# Patient Record
Sex: Male | Born: 1945 | Race: White | Hispanic: No | Marital: Married | Smoking: Former smoker
Health system: Southern US, Community
[De-identification: ages and names within clinical notes are randomized; demographics above are authoritative.]

## PROBLEM LIST (undated history)

## (undated) DIAGNOSIS — E785 Hyperlipidemia, unspecified: Secondary | ICD-10-CM

## (undated) DIAGNOSIS — IMO0002 Reserved for concepts with insufficient information to code with codable children: Secondary | ICD-10-CM

## (undated) DIAGNOSIS — S329XXA Fracture of unspecified parts of lumbosacral spine and pelvis, initial encounter for closed fracture: Secondary | ICD-10-CM

## (undated) DIAGNOSIS — I2510 Atherosclerotic heart disease of native coronary artery without angina pectoris: Secondary | ICD-10-CM

## (undated) DIAGNOSIS — C760 Malignant neoplasm of head, face and neck: Secondary | ICD-10-CM

## (undated) DIAGNOSIS — I6529 Occlusion and stenosis of unspecified carotid artery: Secondary | ICD-10-CM

## (undated) DIAGNOSIS — Z923 Personal history of irradiation: Secondary | ICD-10-CM

## (undated) DIAGNOSIS — S14127A Central cord syndrome at C7 level of cervical spinal cord, initial encounter: Secondary | ICD-10-CM

## (undated) DIAGNOSIS — E1149 Type 2 diabetes mellitus with other diabetic neurological complication: Principal | ICD-10-CM

## (undated) DIAGNOSIS — I2119 ST elevation (STEMI) myocardial infarction involving other coronary artery of inferior wall: Secondary | ICD-10-CM

## (undated) DIAGNOSIS — I10 Essential (primary) hypertension: Secondary | ICD-10-CM

## (undated) DIAGNOSIS — K5790 Diverticulosis of intestine, part unspecified, without perforation or abscess without bleeding: Secondary | ICD-10-CM

## (undated) DIAGNOSIS — C189 Malignant neoplasm of colon, unspecified: Principal | ICD-10-CM

## (undated) HISTORY — PX: CARPAL TUNNEL RELEASE: SHX101

## (undated) HISTORY — DX: Personal history of irradiation: Z92.3

## (undated) HISTORY — DX: Essential (primary) hypertension: I10

## (undated) HISTORY — PX: ELBOW SURGERY: SHX618

## (undated) HISTORY — DX: Diverticulosis of intestine, part unspecified, without perforation or abscess without bleeding: K57.90

## (undated) HISTORY — DX: Fracture of unspecified parts of lumbosacral spine and pelvis, initial encounter for closed fracture: S32.9XXA

## (undated) HISTORY — DX: Central cord syndrome at C7 level of cervical spinal cord, initial encounter: S14.127A

## (undated) HISTORY — PX: TONSILLECTOMY: SUR1361

## (undated) HISTORY — DX: Hyperlipidemia, unspecified: E78.5

## (undated) HISTORY — DX: Malignant neoplasm of colon, unspecified: C18.9

## (undated) HISTORY — DX: Occlusion and stenosis of unspecified carotid artery: I65.29

## (undated) HISTORY — DX: Reserved for concepts with insufficient information to code with codable children: IMO0002

## (undated) HISTORY — DX: ST elevation (STEMI) myocardial infarction involving other coronary artery of inferior wall: I21.19

## (undated) HISTORY — DX: Type 2 diabetes mellitus with other diabetic neurological complication: E11.49

## (undated) HISTORY — DX: Malignant neoplasm of head, face and neck: C76.0

## (undated) HISTORY — DX: Atherosclerotic heart disease of native coronary artery without angina pectoris: I25.10

## (undated) HISTORY — PX: OTHER SURGICAL HISTORY: SHX169

---

## 1986-07-21 DIAGNOSIS — I2119 ST elevation (STEMI) myocardial infarction involving other coronary artery of inferior wall: Secondary | ICD-10-CM

## 1986-07-21 HISTORY — DX: ST elevation (STEMI) myocardial infarction involving other coronary artery of inferior wall: I21.19

## 1986-11-19 DIAGNOSIS — I2510 Atherosclerotic heart disease of native coronary artery without angina pectoris: Secondary | ICD-10-CM

## 1986-11-19 HISTORY — DX: Atherosclerotic heart disease of native coronary artery without angina pectoris: I25.10

## 1990-11-19 DIAGNOSIS — S329XXA Fracture of unspecified parts of lumbosacral spine and pelvis, initial encounter for closed fracture: Secondary | ICD-10-CM

## 1990-11-19 HISTORY — DX: Fracture of unspecified parts of lumbosacral spine and pelvis, initial encounter for closed fracture: S32.9XXA

## 1992-12-19 HISTORY — PX: PENILE PROSTHESIS IMPLANT: SHX240

## 1997-03-21 HISTORY — PX: OTHER SURGICAL HISTORY: SHX169

## 1997-10-18 ENCOUNTER — Encounter: Admission: RE | Admit: 1997-10-18 | Discharge: 1998-01-16 | Payer: Self-pay | Admitting: Emergency Medicine

## 1998-01-09 ENCOUNTER — Ambulatory Visit (HOSPITAL_BASED_OUTPATIENT_CLINIC_OR_DEPARTMENT_OTHER): Admission: RE | Admit: 1998-01-09 | Discharge: 1998-01-09 | Payer: Self-pay | Admitting: Orthopedic Surgery

## 2001-07-21 DIAGNOSIS — C760 Malignant neoplasm of head, face and neck: Secondary | ICD-10-CM

## 2001-07-21 HISTORY — DX: Malignant neoplasm of head, face and neck: C76.0

## 2001-11-18 HISTORY — PX: ANGIOPLASTY: SHX39

## 2001-11-18 HISTORY — PX: CARDIAC CATHETERIZATION: SHX172

## 2001-11-29 ENCOUNTER — Ambulatory Visit (HOSPITAL_COMMUNITY): Admission: RE | Admit: 2001-11-29 | Discharge: 2001-11-29 | Payer: Self-pay | Admitting: Interventional Cardiology

## 2001-11-29 ENCOUNTER — Encounter: Payer: Self-pay | Admitting: Interventional Cardiology

## 2001-11-29 ENCOUNTER — Encounter: Payer: Self-pay | Admitting: Family Medicine

## 2002-07-18 ENCOUNTER — Ambulatory Visit (HOSPITAL_BASED_OUTPATIENT_CLINIC_OR_DEPARTMENT_OTHER): Admission: RE | Admit: 2002-07-18 | Discharge: 2002-07-18 | Payer: Self-pay | Admitting: Otolaryngology

## 2002-07-18 ENCOUNTER — Encounter (INDEPENDENT_AMBULATORY_CARE_PROVIDER_SITE_OTHER): Payer: Self-pay | Admitting: Specialist

## 2002-07-26 ENCOUNTER — Encounter: Admission: RE | Admit: 2002-07-26 | Discharge: 2002-07-26 | Payer: Self-pay | Admitting: Otolaryngology

## 2002-07-26 ENCOUNTER — Encounter: Payer: Self-pay | Admitting: Otolaryngology

## 2002-07-29 ENCOUNTER — Ambulatory Visit (HOSPITAL_BASED_OUTPATIENT_CLINIC_OR_DEPARTMENT_OTHER): Admission: RE | Admit: 2002-07-29 | Discharge: 2002-07-29 | Payer: Self-pay | Admitting: Otolaryngology

## 2002-07-29 ENCOUNTER — Encounter (INDEPENDENT_AMBULATORY_CARE_PROVIDER_SITE_OTHER): Payer: Self-pay | Admitting: *Deleted

## 2002-08-04 ENCOUNTER — Ambulatory Visit: Admission: RE | Admit: 2002-08-04 | Discharge: 2002-10-17 | Payer: Self-pay | Admitting: Radiation Oncology

## 2002-08-09 ENCOUNTER — Encounter (HOSPITAL_COMMUNITY): Admission: RE | Admit: 2002-08-09 | Discharge: 2002-09-21 | Payer: Self-pay | Admitting: Dentistry

## 2002-08-10 ENCOUNTER — Encounter (HOSPITAL_COMMUNITY): Payer: Self-pay | Admitting: Dentistry

## 2002-08-18 ENCOUNTER — Ambulatory Visit (HOSPITAL_COMMUNITY): Admission: RE | Admit: 2002-08-18 | Discharge: 2002-08-18 | Payer: Self-pay | Admitting: Radiation Oncology

## 2002-08-21 DIAGNOSIS — Z923 Personal history of irradiation: Secondary | ICD-10-CM

## 2002-08-21 HISTORY — DX: Personal history of irradiation: Z92.3

## 2002-10-07 ENCOUNTER — Encounter (HOSPITAL_COMMUNITY): Admission: RE | Admit: 2002-10-07 | Discharge: 2002-10-14 | Payer: Self-pay | Admitting: Radiation Oncology

## 2002-10-14 ENCOUNTER — Encounter: Payer: Self-pay | Admitting: Emergency Medicine

## 2002-10-14 ENCOUNTER — Inpatient Hospital Stay (HOSPITAL_COMMUNITY): Admission: AD | Admit: 2002-10-14 | Discharge: 2002-10-17 | Payer: Self-pay | Admitting: Emergency Medicine

## 2002-10-15 ENCOUNTER — Encounter (INDEPENDENT_AMBULATORY_CARE_PROVIDER_SITE_OTHER): Payer: Self-pay | Admitting: Specialist

## 2002-10-20 ENCOUNTER — Ambulatory Visit: Admission: RE | Admit: 2002-10-20 | Discharge: 2002-10-20 | Payer: Self-pay | Admitting: Radiation Oncology

## 2002-11-01 ENCOUNTER — Encounter: Admission: EM | Admit: 2002-11-01 | Discharge: 2002-11-01 | Payer: Self-pay | Admitting: Dentistry

## 2002-11-25 ENCOUNTER — Ambulatory Visit (HOSPITAL_COMMUNITY): Admission: RE | Admit: 2002-11-25 | Discharge: 2002-11-25 | Payer: Self-pay | Admitting: Radiation Oncology

## 2003-02-08 ENCOUNTER — Ambulatory Visit (HOSPITAL_COMMUNITY): Admission: RE | Admit: 2003-02-08 | Discharge: 2003-02-08 | Payer: Self-pay | Admitting: *Deleted

## 2003-02-19 HISTORY — PX: CAROTID ENDARTERECTOMY: SUR193

## 2003-02-24 ENCOUNTER — Encounter: Payer: Self-pay | Admitting: *Deleted

## 2003-02-28 ENCOUNTER — Inpatient Hospital Stay (HOSPITAL_COMMUNITY): Admission: RE | Admit: 2003-02-28 | Discharge: 2003-03-01 | Payer: Self-pay | Admitting: *Deleted

## 2003-02-28 ENCOUNTER — Encounter (INDEPENDENT_AMBULATORY_CARE_PROVIDER_SITE_OTHER): Payer: Self-pay | Admitting: Specialist

## 2003-05-04 ENCOUNTER — Ambulatory Visit: Admission: RE | Admit: 2003-05-04 | Discharge: 2003-05-04 | Payer: Self-pay | Admitting: Radiation Oncology

## 2003-08-22 ENCOUNTER — Ambulatory Visit (HOSPITAL_COMMUNITY): Admission: RE | Admit: 2003-08-22 | Discharge: 2003-08-22 | Payer: Self-pay | Admitting: Radiation Oncology

## 2003-08-24 ENCOUNTER — Ambulatory Visit: Admission: RE | Admit: 2003-08-24 | Discharge: 2003-08-24 | Payer: Self-pay | Admitting: Radiation Oncology

## 2003-11-23 ENCOUNTER — Ambulatory Visit: Admission: RE | Admit: 2003-11-23 | Discharge: 2003-11-23 | Payer: Self-pay | Admitting: Radiation Oncology

## 2004-03-07 ENCOUNTER — Ambulatory Visit: Admission: RE | Admit: 2004-03-07 | Discharge: 2004-03-07 | Payer: Self-pay | Admitting: Radiation Oncology

## 2004-03-21 ENCOUNTER — Ambulatory Visit: Admission: RE | Admit: 2004-03-21 | Discharge: 2004-03-21 | Payer: Self-pay | Admitting: Radiation Oncology

## 2004-06-20 ENCOUNTER — Ambulatory Visit: Admission: RE | Admit: 2004-06-20 | Discharge: 2004-06-20 | Payer: Self-pay | Admitting: Radiation Oncology

## 2004-10-23 ENCOUNTER — Ambulatory Visit: Admission: RE | Admit: 2004-10-23 | Discharge: 2004-10-23 | Payer: Self-pay | Admitting: Radiation Oncology

## 2004-10-31 ENCOUNTER — Ambulatory Visit (HOSPITAL_COMMUNITY): Admission: RE | Admit: 2004-10-31 | Discharge: 2004-10-31 | Payer: Self-pay | Admitting: Radiation Oncology

## 2005-02-26 ENCOUNTER — Ambulatory Visit: Admission: RE | Admit: 2005-02-26 | Discharge: 2005-02-28 | Payer: Self-pay | Admitting: Radiation Oncology

## 2005-07-21 HISTORY — PX: OTHER SURGICAL HISTORY: SHX169

## 2005-11-06 ENCOUNTER — Ambulatory Visit: Admission: RE | Admit: 2005-11-06 | Discharge: 2005-11-27 | Payer: Self-pay | Admitting: Radiation Oncology

## 2005-11-10 ENCOUNTER — Ambulatory Visit (HOSPITAL_COMMUNITY): Admission: RE | Admit: 2005-11-10 | Discharge: 2005-11-10 | Payer: Self-pay | Admitting: Radiation Oncology

## 2006-06-20 DIAGNOSIS — IMO0002 Reserved for concepts with insufficient information to code with codable children: Secondary | ICD-10-CM

## 2006-06-20 HISTORY — DX: Reserved for concepts with insufficient information to code with codable children: IMO0002

## 2006-06-29 ENCOUNTER — Inpatient Hospital Stay (HOSPITAL_COMMUNITY): Admission: EM | Admit: 2006-06-29 | Discharge: 2006-07-07 | Payer: Self-pay | Admitting: Emergency Medicine

## 2006-07-06 ENCOUNTER — Encounter (INDEPENDENT_AMBULATORY_CARE_PROVIDER_SITE_OTHER): Payer: Self-pay | Admitting: Cardiology

## 2006-07-07 ENCOUNTER — Inpatient Hospital Stay (HOSPITAL_COMMUNITY)
Admission: RE | Admit: 2006-07-07 | Discharge: 2006-07-30 | Payer: Self-pay | Admitting: Physical Medicine & Rehabilitation

## 2006-07-07 ENCOUNTER — Ambulatory Visit: Payer: Self-pay | Admitting: Physical Medicine & Rehabilitation

## 2006-09-04 ENCOUNTER — Ambulatory Visit: Payer: Self-pay | Admitting: Physical Medicine & Rehabilitation

## 2006-09-04 ENCOUNTER — Encounter
Admission: RE | Admit: 2006-09-04 | Discharge: 2006-12-03 | Payer: Self-pay | Admitting: Physical Medicine & Rehabilitation

## 2006-09-30 ENCOUNTER — Encounter: Admission: RE | Admit: 2006-09-30 | Discharge: 2006-11-13 | Payer: Self-pay | Admitting: Neurosurgery

## 2006-10-01 ENCOUNTER — Ambulatory Visit: Payer: Self-pay | Admitting: *Deleted

## 2006-11-02 ENCOUNTER — Ambulatory Visit: Payer: Self-pay | Admitting: Physical Medicine & Rehabilitation

## 2006-11-24 ENCOUNTER — Inpatient Hospital Stay (HOSPITAL_COMMUNITY): Admission: RE | Admit: 2006-11-24 | Discharge: 2006-11-27 | Payer: Self-pay | Admitting: Neurosurgery

## 2007-01-15 ENCOUNTER — Ambulatory Visit: Payer: Self-pay | Admitting: *Deleted

## 2007-01-15 ENCOUNTER — Ambulatory Visit: Payer: Self-pay | Admitting: Physical Medicine & Rehabilitation

## 2007-01-15 ENCOUNTER — Encounter
Admission: RE | Admit: 2007-01-15 | Discharge: 2007-04-15 | Payer: Self-pay | Admitting: Physical Medicine & Rehabilitation

## 2007-01-19 LAB — HM COLONOSCOPY: HM Colonoscopy: NORMAL

## 2007-09-19 HISTORY — PX: ROTATOR CUFF REPAIR: SHX139

## 2007-10-27 ENCOUNTER — Observation Stay (HOSPITAL_COMMUNITY): Admission: RE | Admit: 2007-10-27 | Discharge: 2007-10-28 | Payer: Self-pay | Admitting: Orthopedic Surgery

## 2007-11-23 ENCOUNTER — Ambulatory Visit (HOSPITAL_COMMUNITY): Admission: RE | Admit: 2007-11-23 | Discharge: 2007-11-23 | Payer: Self-pay | Admitting: Radiation Oncology

## 2007-12-09 ENCOUNTER — Ambulatory Visit: Payer: Self-pay | Admitting: *Deleted
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
## 2008-07-04 ENCOUNTER — Ambulatory Visit: Payer: Self-pay | Admitting: Family Medicine

## 2008-07-04 DIAGNOSIS — I10 Essential (primary) hypertension: Secondary | ICD-10-CM

## 2008-07-04 DIAGNOSIS — I2510 Atherosclerotic heart disease of native coronary artery without angina pectoris: Secondary | ICD-10-CM

## 2008-07-04 DIAGNOSIS — E1140 Type 2 diabetes mellitus with diabetic neuropathy, unspecified: Secondary | ICD-10-CM

## 2008-07-04 DIAGNOSIS — E785 Hyperlipidemia, unspecified: Secondary | ICD-10-CM

## 2008-07-05 ENCOUNTER — Encounter: Payer: Self-pay | Admitting: Family Medicine

## 2008-07-05 LAB — CONVERTED CEMR LAB: LDL Cholesterol: 68 mg/dL

## 2008-07-28 ENCOUNTER — Encounter: Payer: Self-pay | Admitting: Family Medicine

## 2008-08-25 ENCOUNTER — Encounter: Payer: Self-pay | Admitting: Family Medicine

## 2008-09-05 ENCOUNTER — Ambulatory Visit: Payer: Self-pay | Admitting: Family Medicine

## 2008-10-04 ENCOUNTER — Ambulatory Visit: Payer: Self-pay | Admitting: Family Medicine

## 2008-11-13 ENCOUNTER — Encounter: Payer: Self-pay | Admitting: Family Medicine

## 2008-11-14 ENCOUNTER — Ambulatory Visit: Payer: Self-pay | Admitting: Family Medicine

## 2008-11-14 DIAGNOSIS — R1310 Dysphagia, unspecified: Secondary | ICD-10-CM | POA: Insufficient documentation

## 2008-11-14 DIAGNOSIS — K219 Gastro-esophageal reflux disease without esophagitis: Secondary | ICD-10-CM | POA: Insufficient documentation

## 2008-11-17 ENCOUNTER — Encounter: Payer: Self-pay | Admitting: Family Medicine

## 2008-12-07 ENCOUNTER — Ambulatory Visit: Payer: Self-pay | Admitting: *Deleted

## 2009-01-23 ENCOUNTER — Ambulatory Visit: Payer: Self-pay | Admitting: Family Medicine

## 2009-02-07 ENCOUNTER — Ambulatory Visit: Payer: Self-pay | Admitting: Family Medicine

## 2009-02-20 ENCOUNTER — Ambulatory Visit: Payer: Self-pay | Admitting: Family Medicine

## 2009-04-02 ENCOUNTER — Ambulatory Visit: Payer: Self-pay | Admitting: Family Medicine

## 2009-06-21 ENCOUNTER — Ambulatory Visit: Payer: Self-pay | Admitting: Family Medicine

## 2009-06-21 DIAGNOSIS — R5381 Other malaise: Secondary | ICD-10-CM

## 2009-06-21 DIAGNOSIS — R5383 Other fatigue: Secondary | ICD-10-CM

## 2009-06-21 LAB — CONVERTED CEMR LAB
ALT: 17 units/L (ref 0–53)
AST: 20 units/L (ref 0–37)
Albumin: 3.8 g/dL (ref 3.5–5.2)
BUN: 16 mg/dL (ref 6–23)
Cholesterol: 140 mg/dL (ref 0–200)
Creatinine, Ser: 1.5 mg/dL (ref 0.4–1.5)
Eosinophils Relative: 6.2 % — ABNORMAL HIGH (ref 0.0–5.0)
GFR calc non Af Amer: 50.04 mL/min (ref >60)
Glucose, Bld: 133 mg/dL — ABNORMAL HIGH (ref 70–99)
HCT: 42.6 % (ref 39.0–52.0)
Monocytes Relative: 10.7 % (ref 3.0–12.0)
Neutrophils Relative %: 62 % (ref 43.0–77.0)
PSA: 0.65 ng/mL (ref 0.10–4.00)
Platelets: 128 10*3/uL — ABNORMAL LOW (ref 150.0–400.0)
Potassium: 4.7 meq/L (ref 3.5–5.1)
TSH: 4.9 microintl units/mL (ref 0.35–5.50)
Triglycerides: 140 mg/dL (ref 0.0–149.0)
VLDL: 28 mg/dL (ref 0.0–40.0)
WBC: 6.5 10*3/uL (ref 4.5–10.5)

## 2009-07-02 ENCOUNTER — Ambulatory Visit: Payer: Self-pay | Admitting: Family Medicine

## 2009-07-02 LAB — CONVERTED CEMR LAB
Cholesterol, target level: 200 mg/dL
LDL Goal: 70 mg/dL

## 2009-11-06 ENCOUNTER — Ambulatory Visit: Payer: Self-pay | Admitting: Family Medicine

## 2009-11-27 ENCOUNTER — Ambulatory Visit: Payer: Self-pay | Admitting: Family Medicine

## 2009-11-27 LAB — CONVERTED CEMR LAB
BUN: 19 mg/dL (ref 6–23)
Calcium: 9.4 mg/dL (ref 8.4–10.5)
Creatinine, Ser: 1.3 mg/dL (ref 0.4–1.5)
GFR calc non Af Amer: 60.56 mL/min (ref >60)
Hgb A1c MFr Bld: 6.6 % — ABNORMAL HIGH (ref 4.6–6.5)

## 2009-11-29 ENCOUNTER — Ambulatory Visit: Payer: Self-pay | Admitting: Vascular Surgery

## 2009-12-05 ENCOUNTER — Ambulatory Visit: Payer: Self-pay | Admitting: Family Medicine

## 2010-04-29 ENCOUNTER — Encounter: Payer: Self-pay | Admitting: Family Medicine

## 2010-06-12 ENCOUNTER — Encounter: Payer: Self-pay | Admitting: Family Medicine

## 2010-06-26 ENCOUNTER — Telehealth (INDEPENDENT_AMBULATORY_CARE_PROVIDER_SITE_OTHER): Payer: Self-pay | Admitting: *Deleted

## 2010-07-03 ENCOUNTER — Ambulatory Visit: Payer: Self-pay | Admitting: Family Medicine

## 2010-07-03 LAB — CONVERTED CEMR LAB
AST: 15 units/L (ref 0–37)
BUN: 15 mg/dL (ref 6–23)
Basophils Absolute: 0.1 10*3/uL (ref 0.0–0.1)
Basophils Relative: 1.1 % (ref 0.0–3.0)
Eosinophils Relative: 8.4 % — ABNORMAL HIGH (ref 0.0–5.0)
GFR calc non Af Amer: 62.72 mL/min (ref >60.00)
HCT: 40.1 % (ref 39.0–52.0)
Hemoglobin: 13.5 g/dL (ref 13.0–17.0)
LDL Cholesterol: 80 mg/dL (ref 0–99)
Lymphs Abs: 1.6 10*3/uL (ref 0.7–4.0)
Monocytes Relative: 8.8 % (ref 3.0–12.0)
Neutro Abs: 3.5 10*3/uL (ref 1.4–7.7)
Potassium: 4.5 meq/L (ref 3.5–5.1)
RDW: 12.8 % (ref 11.5–14.6)
Sodium: 138 meq/L (ref 135–145)
TSH: 7.01 microintl units/mL — ABNORMAL HIGH (ref 0.35–5.50)
Total Bilirubin: 0.4 mg/dL (ref 0.3–1.2)
VLDL: 34 mg/dL (ref 0.0–40.0)

## 2010-07-08 ENCOUNTER — Encounter: Payer: Self-pay | Admitting: Family Medicine

## 2010-07-08 ENCOUNTER — Telehealth: Payer: Self-pay | Admitting: Family Medicine

## 2010-07-08 ENCOUNTER — Ambulatory Visit: Payer: Self-pay | Admitting: Family Medicine

## 2010-07-08 DIAGNOSIS — E039 Hypothyroidism, unspecified: Secondary | ICD-10-CM

## 2010-07-08 LAB — CONVERTED CEMR LAB
T3, Free: 2.7 pg/mL (ref 2.3–4.2)
T4, Total: 6.8 ug/dL (ref 5.0–12.5)

## 2010-07-09 ENCOUNTER — Encounter (INDEPENDENT_AMBULATORY_CARE_PROVIDER_SITE_OTHER): Payer: Self-pay | Admitting: *Deleted

## 2010-08-14 ENCOUNTER — Ambulatory Visit
Admission: RE | Admit: 2010-08-14 | Discharge: 2010-08-14 | Payer: Self-pay | Source: Home / Self Care | Attending: Family Medicine | Admitting: Family Medicine

## 2010-08-20 NOTE — Assessment & Plan Note (Signed)
Summary: 10:00 30 MIN APPT/CK LACERATED FINGER   Vital Signs:  Patient profile:   65 year old male Height:      69 inches Weight:      243.6 pounds BMI:     36.10 Temp:     97.6 degrees F oral Pulse rate:   72 / minute Pulse rhythm:   regular BP sitting:   110 / 70  (left arm) Cuff size:   regular  Vitals Entered By: Benny Lennert CMA Duncan Dull) (November 06, 2009 10:02 AM)  History of Present Illness: Chief complaint check lacerated finger.  Cut his left second finger on a piece of glass this past Saturday, bled profusely. Went to urgent care in [place]. They told him there was nothing to suture but had large piece of skin gone, so wrapped tightly and advised to follow up today.  No further bleeding. No fevers, chill, nausea or vomiting.  Current Medications (verified): 1)  Adult Aspirin Ec Low Strength 81 Mg Tbec (Aspirin) .... Take One Tablet Daily 2)  Simvastatin 40 Mg Tabs (Simvastatin) .... Take One Tablet Daily 3)  Men's Multivitamin .... Take One Tablet Daily 4)  Lisinopril 10 Mg Tabs (Lisinopril) .Marland Kitchen.. 1 By Mouth Daily  Allergies (verified): No Known Drug Allergies  Review of Systems      See HPI General:  Denies chills and fever. GI:  Denies nausea and vomiting.  Physical Exam  General:  alert, well-developed, well-nourished, and well-hydrated.   Msk:  second digit left hand-  2 cm open laceration, no foul odor, discharge or surrounding erythema. Psych:  Cognition and judgment appear intact. Alert and cooperative with normal attention span and concentration. No apparent delusions, illusions, hallucinations   Impression & Recommendations:  Problem # 1:  OPEN WOUND HAND NO FINGER ALONE W/O MENTION COMP (ICD-882.0) Assessment New Appears to be healing well. Irrigated area with sterile saline and applied antibiotic ointment with bandaid. Advised keeping area dry for another several days while his finger heals. Follow up with [place] if any changes such as feeling  sick, foul odor, surrounding erythema. Pt in agreement with plan.  Complete Medication List: 1)  Adult Aspirin Ec Low Strength 81 Mg Tbec (Aspirin) .... Take one tablet daily 2)  Simvastatin 40 Mg Tabs (Simvastatin) .... Take one tablet daily 3)  Men's Multivitamin  .... Take one tablet daily 4)  Lisinopril 10 Mg Tabs (Lisinopril) .Marland Kitchen.. 1 by mouth daily
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
## 2010-08-20 NOTE — Progress Notes (Signed)
----   Converted from flag ---- ---- 06/25/2010 4:24 PM, Hannah Beat MD wrote: Prephysical Labs, several days before, fasting BMP, HFP, FLP, CBC with diff, TSH, PSA: 272.4, v58.69, v76.44  Hgba1c = 250.00  ---- 06/25/2010 12:28 PM, Liane Comber CMA (AAMA) wrote: Lab orders please! Good Morning! This pt is scheduled for cpx labs Wed, which labs to draw and dx codes to use? Thanks Tasha ------------------------------

## 2010-08-20 NOTE — Letter (Signed)
Summary: Health Risk Assessment/[facility]  Health Risk Assessment/[facility]   Imported By: Lanelle Bal 05/22/2010 09:50:49  _____________________________________________________________________  External Attachment:    Type:   Image     Comment:   External Document

## 2010-08-22 NOTE — Miscellaneous (Signed)
  Clinical Lists Changes  Medications: Added new medication of METFORMIN HCL 500 MG XR24H-TAB (METFORMIN HCL) take one tablet daily - Signed Rx of METFORMIN HCL 500 MG XR24H-TAB (METFORMIN HCL) take one tablet daily;  #30 x 11;  Signed;  Entered by: Benny Lennert CMA (AAMA);  Authorized by: Hannah Beat MD;  Method used: Electronically to [facility] [address] [place], [place]  [address], Ph: [phone], Fax: [phone]    Prescriptions: METFORMIN HCL 500 MG XR24H-TAB (METFORMIN HCL) take one tablet daily  #30 x 11   Entered by:   Benny Lennert CMA (AAMA)   Authorized by:   Hannah Beat MD   Signed by:   Benny Lennert CMA (AAMA) on 07/09/2010   Method used:   Electronically to        [facility]* (retail)       [address]       [place], [place]  [address]       Ph: [phone]       Fax: [phone]   RxID:   [id number]   Prior Medications: ADULT ASPIRIN EC LOW STRENGTH 81 MG TBEC (ASPIRIN) take one tablet daily SIMVASTATIN 40 MG TABS (SIMVASTATIN) take one tablet daily MEN'S MULTIVITAMIN () take one tablet daily LISINOPRIL 10 MG TABS (LISINOPRIL) 1 by mouth daily Current Allergies: No known allergies

## 2010-08-22 NOTE — Letter (Signed)
Summary: [employer] conservation officer [employer] Wellness Visit Questionnaire   [employer] Annual Wellness Visit Questionnaire   Imported By: Beau Fanny 07/08/2010 10:15:17  _____________________________________________________________________  External Attachment:    Type:   Image     Comment:   External Document
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
## 2010-08-22 NOTE — Miscellaneous (Signed)
Summary: Zostavax vaccine  Clinical Lists Changes  Observations: Added new observation of ZOSTAVAX: Zostavax (07/08/2010 15:28) Given at [facility], Lot# [id number] exp 03/08/2011.      Immunization History:  Zostavax History:    Zostavax # 1:  zostavax (07/08/2010)

## 2010-08-22 NOTE — Progress Notes (Signed)
Summary: needs order for  zostavax   Phone Note Call from Patient Call back at Home Phone [phone]   Caller: Patient Call For: Michael Holder Summary of Call: Patient is asking for a rx to be sent to [facility] for him to have the shingles vaccine. He has allready contacted his insurance and it is covered w/ a $30 dollar copay. Initial call taken by: Melody Comas,  July 08, 2010 10:13 AM  Follow-up for Phone Call        Patient advised but, can only have injection done at pharmacy not at our office.Consuello Masse CMA    ok, call [place]. Michael Holder  July 08, 2010 10:37 AM  Follow-up by: Benny Lennert CMA Duncan Dull),  July 08, 2010 10:31 AM    New/Updated Medications: ZOSTAVAX 29562 UNT/0.65ML SOLR (ZOSTER VACCINE LIVE) Disp, 1, bring to office for injection. 1 dose Prescriptions: ZOSTAVAX 13086 UNT/0.65ML SOLR (ZOSTER VACCINE LIVE) Disp, 1, bring to office for injection. 1 dose  #1 x 0   Entered and Authorized by:   Michael Holder   Signed by:   Michael Holder on 07/08/2010   Method used:   Electronically to        [facility]* (retail)       [address]       [place], [place]  [address]       Ph: [phone]       Fax: [phone]   RxID:   [id number]

## 2010-08-22 NOTE — Assessment & Plan Note (Signed)
Summary: 6 wk f/u dlo   Vital Signs:  Patient profile:   65 year old male Height:      69 inches Weight:      236.25 pounds BMI:     35.01 Temp:     98.0 degrees F oral Pulse rate:   72 / minute Pulse rhythm:   regular BP sitting:   110 / 80  (left arm) Cuff size:   large  Vitals Entered By: Benny Lennert CMA Duncan Dull) (August 14, 2010 3:24 PM)  History of Present Illness: Chief complaint 6 week follow up diabetes  New onset treatment DM:    Allergies (verified): No Known Drug Allergies   Impression & Recommendations:  Problem # 1:  DIABETES MELLITUS, TYPE II (ICD-250.00) >15 minutes spent in face to face time with patient, >50% spent in counselling or coordination of care: new onset diagnosis of diabetes, discussed pathophys, exercise, diet, medications, ongoing longterm care generally needed for all patients. sophisticated patient who is highly aware.  His updated medication list for this problem includes:    Adult Aspirin Ec Low Strength 81 Mg Tbec (Aspirin) .Marland Kitchen... Take one tablet daily    Lisinopril 10 Mg Tabs (Lisinopril) .Marland Kitchen... 1 by mouth daily    Metformin Hcl 500 Mg Xr24h-tab (Metformin hcl) .Marland Kitchen... Take one tablet daily  Complete Medication List: 1)  Adult Aspirin Ec Low Strength 81 Mg Tbec (Aspirin) .... Take one tablet daily 2)  Simvastatin 40 Mg Tabs (Simvastatin) .... Take one tablet daily 3)  Men's Multivitamin  .... Take one tablet daily 4)  Lisinopril 10 Mg Tabs (Lisinopril) .Marland Kitchen.. 1 by mouth daily 5)  Metformin Hcl 500 Mg Xr24h-tab (Metformin hcl) .... Take one tablet daily  Patient Instructions: 1)  f/u 6 months 2)  HgBA1c prior to visit  ICD-9: 250.00 3)  Urine Microalbumin prior to visit ICD-9 :    Orders Added: 1)  Est. Patient Level III [16109]    Current Allergies (reviewed today): No known allergies

## 2010-11-22 ENCOUNTER — Encounter: Payer: Self-pay | Admitting: Family Medicine

## 2010-11-25 ENCOUNTER — Encounter: Payer: Self-pay | Admitting: Family Medicine

## 2010-11-25 ENCOUNTER — Ambulatory Visit (INDEPENDENT_AMBULATORY_CARE_PROVIDER_SITE_OTHER): Payer: Medicare Other | Admitting: Family Medicine

## 2010-11-25 DIAGNOSIS — H6120 Impacted cerumen, unspecified ear: Secondary | ICD-10-CM

## 2010-11-25 NOTE — Patient Instructions (Signed)
I would put warm water in the ear for several minutes several times a day and that should finish the remainder of the wax.  Follow up as needed.  Take care.

## 2010-11-25 NOTE — Progress Notes (Signed)
No right ear pain but it is stopped up. Using OTC drops.  Stated about 1 week ago. Occ ringing.  No FCNAVD.  Dry mouth is chronic.  No other new sx.    Meds, vitals, and allergies reviewed.   ROS: See HPI.  Otherwise, noncontributory.  nad ncat L TM wnl, canal wnl R tm with small amount of wax against the drum.  No TM erythema, canal wnl o/w Op w/o erythema

## 2010-11-25 NOTE — Assessment & Plan Note (Signed)
Irrigated and some transient relief but the was wasn't fully removed.  Ear rechecked and irrigation stopped.  TM stable, intact.  Irrigation tolerated well.   I would put warm water in the ear for several minutes several times a day and that should finish the remainder of the wax.  Follow up as needed.

## 2010-11-28 ENCOUNTER — Ambulatory Visit (INDEPENDENT_AMBULATORY_CARE_PROVIDER_SITE_OTHER): Payer: Medicare Other | Admitting: Family Medicine

## 2010-11-28 ENCOUNTER — Encounter: Payer: Self-pay | Admitting: Family Medicine

## 2010-11-28 VITALS — BP 130/80 | HR 66 | Temp 97.8°F | Ht 70.0 in | Wt 234.4 lb

## 2010-11-28 DIAGNOSIS — E119 Type 2 diabetes mellitus without complications: Secondary | ICD-10-CM

## 2010-11-28 LAB — BASIC METABOLIC PANEL
GFR: 64.45 mL/min (ref >60.00)
Potassium: 4.8 mEq/L (ref 3.5–5.1)
Sodium: 139 mEq/L (ref 135–145)

## 2010-11-28 LAB — HEMOGLOBIN A1C: Hgb A1c MFr Bld: 6.5 % (ref 4.6–6.5)

## 2010-11-28 MED ORDER — TAMSULOSIN HCL 0.4 MG PO CAPS: 0.4000 mg | ORAL_CAPSULE | Freq: Every day | ORAL | Status: DC

## 2010-11-28 NOTE — Patient Instructions (Signed)
Benign Prostatic Hyperplasia You have an enlarged prostate. This is common in elderly males. It is called BPH. This stands for benign prostate hyperplasia. The prostate gland is located in base of the bladder. When it grows, the prostate blocks the urethra. This is the tube which drains urine from the bladder.  SYMPTOMS OF BPH INCLUDE:  Weak urine stream.  Dribbling.   Feeling like the bladder has not emptied completely.   Difficulty starting urination.  Getting up frequently at night to urinate.   Urinating more frequently during the day.   Complete urinary blockage or severe pain with urination requires immediate attention. DIAGNOSIS  Your caregiver often has a good idea what is wrong by taking a history and doing a physical exam.   Special x-rays may be done.  TREATMENT  For mild problems, no treatment may be necessary.   If the problems are moderate, medications may provide relief. Some of these work by making the prostate gland smaller. The herb saw palmetto is commonly used.   If complete blockage occurs, a Foley catheter is usually left in place for a few days.   Surgery is often needed for more severe problems. TURP is the prostate surgery for BPH which is done through the urethra. TURP stands for transurethral resection of the prostate. It involves cutting away chips from the prostate. It is done by removing chips so that they can come out through the penis.   Techniques using heat, microwave and laser to remove the prostate blockage are also being used.  HOME CARE INSTRUCTIONS  Give yourself time when you urinate.   Stay away from alcohol.   Beverages containing caffeine such as coffee, tea and colas can make the problems worse.   Decongestants, antihistamines, and some prescription medicines can also make the problem worse.   Follow up with your caregiver for further treatment as recommended.  SEEK IMMEDIATE MEDICAL CARE IF:  You develop increased pain with  urination or are unable to pass your water.   You develop severe abdominal pain, vomiting, a high fever, or fainting.   You develop back pain or blood in your urine.  MAKE SURE YOU:   Understand these instructions.   Will watch your condition.   Will get help right away if you are not doing well or get worse.  Document Released: 07/07/2005 Document Re-Released: 10/01/2009 Exit[employer] Patient Information 2011 [employer]. 

## 2010-11-28 NOTE — Progress Notes (Signed)
65 year old male:  3-4 times a night going to the restroom. A couple of weeks ago BPH, on exam, mild-mod prostatic enlargement on last exam New prob, never been on meds  Diabetes Mellitus: Tolerating Medications:y Foot problems: none Hypoglycemia: none No nausea, vomitting, blurred vision, polyuria.  Lab Results  Component Value Date   HGBA1C 6.6* 07/08/2010      Chemistry      Component Value Date/Time   NA 138 07/03/2010 0824   K 4.5 07/03/2010 0824   CL 101 07/03/2010 0824   CO2 30 07/03/2010 0824   BUN 15 07/03/2010 0824   CREATININE 1.2 07/03/2010 0824      Component Value Date/Time   CALCIUM 8.8 07/03/2010 0824   ALKPHOS 64 07/03/2010 0824   AST 15 07/03/2010 0824   ALT 15 07/03/2010 0824   BILITOT 0.4 07/03/2010 0824     The PMH, PSH, Social History, Family History, Medications, and allergies have been reviewed in [facility], and have been updated if relevant.  GEN: WDWN, NAD, Non-toxic, A & O x 3 HEENT: Atraumatic, Normocephalic. Neck supple. No masses, No LAD. Ears and Nose: No external deformity. CV: RRR, No M/G/R. No JVD. No thrill. No extra heart sounds. PULM: CTA B, no wheezes, crackles, rhonchi. No retractions. No resp. distress. No accessory muscle use. EXTR: No c/c/e NEURO Normal gait.  PSYCH: Normally interactive. Conversant. Not depressed or anxious appearing.  Calm demeanor.   A/P: BPH, new, symptomatic, trial of flomax 2. DM, controlled, check a1c and bmp

## 2010-12-03 NOTE — Procedures (Signed)
CAROTID DUPLEX EXAM   INDICATION:  Follow up right CEA.   HISTORY:  Diabetes:  Yes.  Cardiac:  MI, PTCA.  Hypertension:  Yes.  Smoking:  Previous.  Previous Surgery:  Right CEA, 02/28/2003, by Dr. Madilyn Fireman.  CV History:  Asymptomatic.  Amaurosis Fugax No, Paresthesias No, Hemiparesis No.                                       RIGHT             LEFT  Brachial systolic pressure:         156               152  Brachial Doppler waveforms:         WNL               WNL  Vertebral direction of flow:        Antegrade         Antegrade  DUPLEX VELOCITIES (cm/sec)  CCA peak systolic                   153               131  ECA peak systolic                   96                135  ICA peak systolic                   80                109  ICA end diastolic                   32                42  PLAQUE MORPHOLOGY:                  Mixed             Mixed  PLAQUE AMOUNT:                      Mild              Mild  PLAQUE LOCATION:                    ICA/ECA           ICA/ECA/CCA   IMPRESSION:  1. Bilateral internal carotid arteries show evidence of 1% to 39%      stenosis.  2. No significant changes from previous study.     ___________________________________________  Di Kindle. Edilia Bo, M.D.   AS/MEDQ  D:  11/29/2009  T:  11/29/2009  Job:  [id number]

## 2010-12-03 NOTE — Procedures (Signed)
CAROTID DUPLEX EXAM   INDICATION:  Followup, carotid artery disease.   HISTORY:  Diabetes:  Yes.  Cardiac:  PTCA.  Hypertension:  Yes.  Smoking:  Quit.  Previous Surgery:  Right CEA with DPA on 02/28/03.  CV History:  No.  Amaurosis Fugax No, Paresthesias No, Hemiparesis No                                       RIGHT             LEFT  Brachial systolic pressure:         150               148  Brachial Doppler waveforms:         Biphasic          Biphasic  Vertebral direction of flow:        Antegrade         Antegrade  DUPLEX VELOCITIES (cm/sec)  CCA peak systolic                   92                84  ECA peak systolic                   58                98  ICA peak systolic                   78                75  ICA end diastolic                   25                26  PLAQUE MORPHOLOGY:                  Mixed             Mixed  PLAQUE AMOUNT:                      Mild              Mild  PLAQUE LOCATION:                    ICA/CCA           ICA/CCA/ECA   IMPRESSION:  1. Right internal carotid artery shows evidence of 20-39% stenosis,      status post carotid endarterectomy.  2. Left internal carotid artery shows evidence of 20-39% stenosis.  3. No significant changes from previous study.   ___________________________________________  P. Liliane Bade, M.D.   AS/MEDQ  D:  12/09/2007  T:  12/09/2007  Job:  [id number]
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
## 2010-12-03 NOTE — Procedures (Signed)
CAROTID DUPLEX EXAM   INDICATION:  Right carotid endarterectomy.   HISTORY:  Diabetes:  Yes.  Cardiac:  PTCA.  Hypertension:  Yes.  Smoking:  Quit.  Previous Surgery:  Right carotid endarterectomy on 02/28/03.  CV History:  Currently asymptomatic.  Amaurosis Fugax No, Paresthesias No, Hemiparesis No.                                       RIGHT             LEFT  Brachial systolic pressure:         152               148  Brachial Doppler waveforms:         Normal            Normal  Vertebral direction of flow:        Antegrade         Antegrade  DUPLEX VELOCITIES (cm/sec)  CCA peak systolic                   131               113  ECA peak systolic                   88                133  ICA peak systolic                   65                79  ICA end diastolic                   47                26  PLAQUE MORPHOLOGY:                  Mixed             Mixed  PLAQUE AMOUNT:                      Mild              Mild  PLAQUE LOCATION:                    ICA/CCA           ICA/ECA/CCA   IMPRESSION:  1. 1-39% stenosis of the bilateral internal carotid arteries.  2. No significant change noted when compared to the previous      examination on 12/09/07.       ___________________________________________  P. Liliane Bade, M.D.   CH/MEDQ  D:  12/07/2008  T:  12/07/2008  Job:  [id number]
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
## 2010-12-06 NOTE — H&P (Signed)
NAME:  Michael Holder, Michael Holder                        ACCOUNT NO.:  [id number]   MEDICAL RECORD NO.:  [id number]                   PATIENT TYPE:  INP   LOCATION:  5711                                 FACILITY:  [facility]   PHYSICIAN:  Reuben Likes, M.D.               DATE OF BIRTH:  [birth year]   DATE OF ADMISSION:  10/14/2002  DATE OF DISCHARGE:                                HISTORY & PHYSICAL   CHIEF COMPLAINT:  Unable to eat and black stools.   HISTORY OF PRESENT ILLNESS:  The patient is a 65 year old male with type 2  diabetes who was found to have two large lymph nodes in the right anterior  cervical chain on June 20, 2002.  The biopsy was done by Dr. Narda Bonds  on July 18, 2002 which showed squamous cell carcinoma.  The patient also  had a laryngoscopy and a tonsillectomy at that time but no obvious source of  the squamous cell carcinoma was found.  He was referred to radiation  oncology at [facility] and underwent 32 radiation treatments.  Over the past month, he developed mucositis with sore throat and pain on  swallowing.  He has had very little p.o. intake over the past two weeks,  only had about two bowel movements in the past month.  He finished up his 32  radiation treatments four days ago.  Due to dehydration and orthostatic  hypertension, he has received some IV fluids as an outpatient.  Today, he  still has postural hypotension and the CBC shows a hemoglobin of 10 with a  baseline of 17.  He reported two black tarry stools last night.  He has had  nausea, vomiting, retching, and dry heaves.  He denies any hematemesis.  He  does have slight epigastric tenderness which he attributes to the dry  heaves.  He has lost 50 pounds in the past year.  He feels chills and is  constipated.   PAST MEDICAL HISTORY:   SURGERIES:  He underwent a penile implant on September 01, 1994 done by Dr.  Logan Bores.  He underwent carpal tunnel release in June of 1999, and lymph  node  biopsy was done in December of 2003.  He also had a cervical fusion done by  Dr. Channing Mutters in January of 1998.   OTHER HOSPITALIZATIONS:  The patient was hospitalized for a heart cath in  April 1988 by Dr. Garnette Scheuermann.  He had an inferior MI and underwent a PTCA to  his right coronary artery.  He also was hospitalized in March 1992 at [facility]  because of a fractured pelvis by Dr. Bartolo Darter.   OTHER ILLNESS:  He has had diabetes type 2 since 1998, hypertension since  1998, and hyperlipidemia.   MEDICATIONS:  1. Aspirin 325 mg once a day.  2. Lipitor 20 mg once a day.  3. Hyzaar 50/12.5 mg once a day.  4.  Glyburide 5 mg two b.i.d. which he stopped two weeks ago.  5. Lopressor 50 mg b.i.d.  6. Metformin 500 mg b.i.d.  He stopped this two weeks ago.   ALLERGIES:  No known drug allergies.   FAMILY HISTORY:  His father died at age 67 of a stroke.  His mother has had  a stroke and high blood pressure.  She is 90.  A brother has diabetes.  Four  sisters are in good health.  He has no children.   SOCIAL HISTORY:  The patient works as a Merchandiser, retail.  He is married.  He does  not use alcohol or tobacco.   REVIEW OF SYSTEMS:  No other systemic skin, eye, ENT, respiratory,  cardiovascular, GI, GU, musculoskeletal, or neurological complaints.   PHYSICAL EXAMINATION:  VITAL SIGNS:  Blood pressure 90/60, pulse 66 and  regular, respirations 16, temperature 97.3.  GENERAL:  He appears alert and in no distress.  He is still a little bit  overweight but has obviously lost weight in the past year.  SKIN:  There is erythema, dryness on the skin of the neck.  Otherwise skin  was clear and warm and dry.  EYES:  Pupils equal, round, and reactive to light.  Full EOMs.  Fundi  benign.  Disks and vessels were normal.  Sclerae were nonicteric.  ENT:  TMs were normal.  Posterior pharynx was red.  No ulcerations or  exudates.  Mucous membranes were moist.  NECK:  Supple.  No adenopathy, JVD, or bruit.  The patient  does have  moderate tenderness to palpation.  LUNGS:  Clear to auscultation and percussion.  HEART:  Regular rhythm, no gallop or murmur.  ABDOMEN:  Soft, nontender, no organomegaly or mass.  Bowel sounds normally  active.  RECTAL:  No masses.  Stool was heme-positive.  EXTREMITIES:  No pedal edema.  Pedal pulses were not felt.  NEUROLOGICAL:  Alert and oriented x3.  Speech was clear and appropriate.  No  extremity weakness or tremor.  DTRs 2+ and symmetrical.  Babinski's  downgoing.  Cranial nerves intact.   IMPRESSION:  1. Gastrointestinal bleed probably ulcer, possibly Mallory-Weiss tear or     ulcer disease.  2. Mucositis secondary to radiation treatment.  3. Poor nutrition and weight loss.  4. Dehydration.  5. Metastatic squamous cell carcinoma, unknown primary.  6. 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  7. Type 2 diabetes.  8. Hypertension.  9. Hyperlipidemia.   PLAN:  1. IV fluids.  2. Follow CBCs.  3. GI consult.  4. May need packed red blood cells if hemoglobin gets below 8.  5. May need a temporary PEG for nutrition if unable to take p.o.'s.                                               Reuben Likes, M.D.    DCK/MEDQ  D:  10/15/2002  T:  10/16/2002  Job:  [id number]

## 2010-12-06 NOTE — Op Note (Signed)
NAME:  TERRELL, OSTRAND                        ACCOUNT NO.:  [id number]   MEDICAL RECORD NO.:  [id number]                   PATIENT TYPE:  OIB   LOCATION:  2899                                 FACILITY:  [facility]   PHYSICIAN:  Balinda Quails, M.D.                 DATE OF BIRTH:  1945-09-28   DATE OF PROCEDURE:  02/08/2003  DATE OF DISCHARGE:                                 OPERATIVE REPORT   DIAGNOSES:  Extracranial cerebrovascular occlusive disease, with severe  right internal carotid artery stenosis.   PROCEDURES:  1. Arch aortogram.  2. Bilateral selective carotid arteriograms.   ACCESS RIGHT COMMON FEMORAL ARTERY:  5-French sheath.   CONTRAST:  110 mL Visipaque.   COMPLICATIONS:  None.   CLINICAL NOTE:  The patient is a 65 year old male with a history of squamous  cell carcinoma of the neck.  He has undergone irradiation for an unknown  primary.  On CT scan he was noted to have evidence of a right carotid  bifurcation plaque.  Doppler evaluation revealed a severe stenosis.  He is  brought to the catheterization lab at this time for diagnostic  arteriography.   PROCEDURE NOTE:  The patient was brought to the catheterization lab in a  stable condition.  Informed consent was obtained.  He was prepped and draped  in a sterile fashion.   Skin and subcutaneous tissue was instilled with 1% Xylocaine in the right  groin.  Needle was easily introduced into the right common femoral artery.  A .035 Wholey guide wire passed through the needle into the mid abdominal  aorta under fluoroscopy.  The needle was removed and a 5-French sheath  advanced over the guide wire.  The guide wire was removed and the sheath  flushed with Heparin and saline solution.   A standard pigtail catheter was then advanced over the guide wire.  The  guide wire and catheter were advanced into the ascending aorta.  Then 30  degree LAO projection arterial aortogram obtained.  This revealed a patent  innominate  artery, with marked tortuosity of the margin of the right  subclavian artery.  The proximal right subclavian and proximal right common  carotid arteries were widely patent.  Large right vertebral artery was  normal in caliber.  The origin of the left common carotid artery was widely  patent.  The left subclavian artery revealed mild stenosis at its origin.  The proximal left subclavian artery was intact without dominate stenosis.  The left vertebral artery revealed normal flow.   The guide wire was then reinserted.  The pigtail catheter was removed.  An  H1 catheter was then advanced over the guide wire.  The H1 catheter was  easily engaged into the origin of the innominate artery.  A guide wire was  advanced into the right common carotid artery origin.  The H1 catheter was  advanced into the right common  carotid artery, and the guide wire removed.  Right cervical and intracranial carotid injections were made.  The right  cervical carotid artery views revealed a severe stenosis of the right  internal carotid artery origin, with a large plaque.  This was estimated to  be greater than 80%.  The right external carotid artery was widely patent.  The remainder of the cervical internal carotid artery was widely patent.   The H1 catheter was then placed back into the aortic arch, and engaged into  the left common carotid artery origin.  Left parotid injections were made  for cervical and intracranial views.  The left common carotid artery was  widely patent in the neck.  The left carotid bifurcation revealed mild  posterior plaque, without significant left internal carotid artery stenosis.  The left external carotid artery was also widely patet nt.   INTERPRETATION OF INTRACRANIAL VIEWS:  This is carried out separately under  neuroradiology interpretation.   ASSESSMENT:  This concluded the arteriogram procedure.  The guide wire was  reinserted and the catheter and guide wire removed.  Right  femoral sheath  removed without apparent incident.  There were no apparent complications.   FINAL IMPRESSION:  1. Patent brachiocephalic origin vessels.  2. Severe right internal carotid artery stenosis.  3. Widely patent left internal carotid artery.   DISPOSITION:  These results have been reviewed with the patient and the  family.  The patient will be scheduled for right carotid endarterectomy for  reduction of stroke risk.  Risks of this operative procedure, including the  potential risks of MI, CVA, cranial nerve injury, along with death of 1-2%  have been discussed.                                               Balinda Quails, M.D.    PGH/MEDQ  D:  02/08/2003  T:  02/08/2003  Job:  [id number]   cc:   Reuben Likes, M.D.  [address]  [place]  [place] [address]  Fax: [phone]   Kristine Garbe. Ezzard Standing, M.D.  [address]  [place] [address]  Fax: [phone]   Artist Pais. Kathrynn Running, M.D.  [address]- [facility]  [place]  [place]  [address]  Fax: [phone]
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
## 2010-12-06 NOTE — Discharge Summary (Signed)
NAME:  Michael Holder, Michael Holder                        ACCOUNT NO.:  [id number]   MEDICAL RECORD NO.:  [id number]                   PATIENT TYPE:  INP   LOCATION:  5711                                 FACILITY:  [facility]   PHYSICIAN:  Reuben Likes, M.D.               DATE OF BIRTH:  15-Apr-1946   DATE OF ADMISSION:  10/14/2002  DATE OF DISCHARGE:  10/17/2002                                 DISCHARGE SUMMARY   SUMMARY OF HISTORY AND PHYSICAL:  [name] is a 65 year old male with  type 2 diabetes who was found to have two large lymph nodes in his right  anterior cervical chain on 06/20/02.  A biopsy done by Dr. Narda Bonds on  07/18/02 showed squamous cell cancer.  The patient also had a laryngoscopy  and tonsillectomy but no obvious primary tumor was found.  He was referred  to radiation oncology at [facility] and underwent 32 radiation  treatments.  Over the past one month, he has developed mucositis with sore  throat and pain on swallowing.  He has had very little p.o. intake for the  past two weeks.  He finished up his 32 radiation treatments four days ago.  Due to dehydration and orthostatic hypotension, he received IV fluids as an  outpatient.  On the day of admission he still had postural hypotension.  A  CBC done showed a hemoglobin of 10 with baseline of 17.  He reports two  black tarry stools last night.  He had some nausea and vomiting and dry  heaves.  He has had no hematemesis.  He had slight epigastric tenderness  which he attributed to dry heaves.  He has lost about 50 pounds in the past  year.  He has felt chilled and was constipated.   PHYSICAL EXAMINATION:  VITAL SIGNS:  Blood pressure 90/60, pulse 66,  respirations 16, temperature 97.3.  GENERAL:  Alert and in no distress.  SKIN:  Erythema, dryness and scaling of neck.  EYES:  Pupils equal, round and reactive to light.  Fundi benign.  ENT:  TMs normal, posterior pharynx red.  NECK:  No adenopathy, moderate  tenderness to palpation.  LUNGS:  Clear.  HEART:  Regular rhythm, no murmur.  ABDOMEN:  Soft, nontender, no organomegaly or mass.  RECTAL:  No masses.  Stool heme-positive.  EXTREMITIES:  No edema.  Pedal pulses not felt.  NEUROLOGIC:  Within normal limits.   IMPRESSION:  1. Gastrointestinal bleed, probably upper, positive Mallory Weiss tear.  2. Mucositis secondary to radiation treatment.  3. Poor nutrition and weight loss.  4. Dehydration.  5. Metastatic squamous cell cancer, unknown primary.  6. Ischemic heart disease, status post myocardial infarction.  7. Type 2 diabetes.  8. Hypertension.  9. Hyperlipidemia.   SUMMARY OF LABORATORY DATA:  Chest x-ray showed no change in elevation of  the left hemidiaphragm, with no objective findings.  Hemoglobin was  10.3  with a white count of 6900, MCV 9.7, platelet count 140,000. At the time of  discharge his hemoglobin had dropped to 8.1, white was count 3300.  His pro  time INR was 1.1.  Sodium 138, potassium 3.7, glucose 162, BUN 41,  creatinine 1.4.  Total protein 5.1, albumin 3.1.  Prealbumin 13.6 which is  low.   HOSPITAL COURSE:  He was admitted to the 5700 floor, vital signs obtained  every four hours with postural vital signs daily, he allowed to be up out of  bed ad lib, given a full liquid diet and IV D5 half normal saline plus 20  mEq of KCl/liter at 150 cc an hour.  I&O's were monitored, as were daily  weights, CBCs were monitored four times a day.  He was given Protonix 40 mg  IV once a day, Phenergan 12.5 mg IV every four hours p.r.n., Lipitor 20 mg  once a day, and sliding scale insulin.   He was seen in GI consultation by Dr. Vida Rigger.   By the following day, he had no nausea or vomiting, no black stools, he  still had slight right flank pain.  He was able to swallow some liquids, he  was unable to get solids down.  He was afebrile, his abdominal exam showed  minimal right flank tenderness with guarding.  His upper  endoscopy showed an  antral ulcer, gastritis, bulbitis.  His esophagus and vocal cords were  normal.  He was continued on IV fluids and Ensure was added.   As of 10/16/02 he had no further nausea or vomiting, he had not had any  stools, he still had slight right flank pain, he was still able to take some  liquids, his throat was not quite as sore.  Glyburide was restarted, his  potassium was replaced, he was started on ferrous sulfate.  As of 10/17/02 he  was keeping all liquids and some solids down, he had nausea or vomiting, his  right flank pain was better.  He was afebrile, lungs were clear.  Cardiac  rhythm was regular without murmur, abdomen showed minimal right flank  tenderness to palpation, hemoglobin was 8.1 which was stable.  It was felt  he could be discharged on that date to follow up the following Thursday.   FINAL DIAGNOSES:  1. Gastric ulcer.  2. Gastrointestinal bleed.  3. Anemia due to blood loss.  4. Dehydration.  5. Malnutrition.  6. Metastatic squamous cell cancer, unknown primary.  7. Mucositis.  8. Ischemic heart disease, status post myocardial infarction.  9. Diabetes.  10.      Hypertension.  11.      Hyperlipidemia.   MEDICATIONS AT THE TIME OF DISCHARGE:  1. Lipitor 20 mg once a day.  2. Glyburide 5 mg one half a day.  3. Protonix 40 mg twice a day.  4. K-Dur 20 mEq daily.  5. Ferrous sulfate 325 mg three times a day.   ACTIVITY:  No restrictions.   DIET:  Diabetic diet.   SPECIAL INSTRUCTIONS:  Should have Ensure, Boost, Instant Breakfast, or  Glucerna three times daily.   FOLLOW-UP INSTRUCTIONS:  He is to see me the following Thursday and also  follow up with Dr. Ewing Schlein and also with Dr. Kathrynn Running, his radiation  oncologist.   ADDENDUM:  His surgical pathology report did show gastric antral  inflammation with no dysplasia or malignancy identified.  There was no evidence of Helicobacter pylori.  Reuben Likes, M.D.    DCK/MEDQ  D:  11/12/2002  T:  11/14/2002  Job:  [id number]   cc:   Petra Kuba, M.D.  [address], [address]  [place]  [place] [address]  Fax: [phone]   Artist Pais. Kathrynn Running, M.D.  [address]- [facility]  [place]  [place]  [address]  Fax: [phone]

## 2010-12-06 NOTE — H&P (Signed)
NAME:  Michael Holder, Michael Holder                        ACCOUNT NO.:  [id number]   MEDICAL RECORD NO.:  [id number]                   PATIENT TYPE:  INP   LOCATION:  NA                                   FACILITY:  [facility]   PHYSICIAN:  Balinda Quails, M.D.                 DATE OF BIRTH:  06/10/1946   DATE OF ADMISSION:  02/28/2003  DATE OF DISCHARGE:                                HISTORY & PHYSICAL   CHIEF COMPLAINT:  Blocked carotid artery.   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  He has been followed by Dr. Kathrynn Running and Dr. Lorenz Coaster and recently underwent a  CT scan of the neck to evaluate the tumor.  There was no evidence on the CT  scan of tumor mass; however, he was found incidentally to have narrowing of  the right internal carotid artery.  He has been completely asymptomatic and  specifically denies TIA symptoms, weakness, amaurosis fugax, syncope,  dizziness, visual changes, or any other neurological symptoms.  He was  referred to Dr. Liliane Bade for evaluation.  He underwent Doppler studies in  our office which showed severe right internal carotid artery stenosis with  mild left internal carotid artery stenosis.  Dr. Madilyn Fireman also performed an  arteriogram at [facility] which confirmed the severe right internal  carotid stenosis.  It is Dr. Madilyn Fireman' opinion the patient should proceed with  a carotid endarterectomy at this time in order to reduce his risk of stoke.   PAST MEDICAL HISTORY:  1. Status post myocardial infarction in 1988.  2. Hypercholesterolemia.  3. Hypertension.  4. 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  5. Peptic ulcer disease which was felt to be related to his cancer     treatments.  6. History of type 2 non-insulin-dependent diabetes mellitus, previously on     medication, but he has currently been taking off his  medications as his     sugars have been very well controlled, and he has had significant weight     loss following his radiation.   PAST SURGICAL HISTORY:  1. Status post PTCA x 3.  2. Repair of broken pelvis following a forklift accident.  3. Bilateral carpal tunnel surgery.  4. Cervical disk fusion.  5. Bone spurs removed from elbow.  6. Penile implant following pelvic surgery.  7. Right neck biopsy.  8. Right tonsillectomy.   CURRENT MEDICATIONS:  1. Lipitor 20 mg q.h.s.  2. Lopressor 50 mg b.i.d.  3. Enteric-coated aspirin 81 mg daily.  4. Multivitamins daily.   ALLERGIES:  No known drug allergies.   FAMILY HISTORY:  His mother is alive at age 69 and has had a TIA.  His  father died of a CVA.  He has one living  brother who has diabetes mellitus.   SOCIAL HISTORY:  He is married and resides in [place].  He has no children.  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  He denies alcohol  use.  He previously smoked up to two packs of cigarettes per day and smoked  for 24 years.  He quit in 1988 at the time of his myocardial infarction.   REVIEW OF SYSTEMS:  See History of Present Illness for pertinent positives  and negatives.  Also, he has had weight loss of around 100 pounds since  February 2004 at which time he had radiation therapy.  He also has had some  residual dysphagia and dry mouth related to radiation.  He has chronic hip  pain related to his previous trauma.  He also has a history of coronary  artery disease which has been followed by Dr. Verdis Prime, although he has  not had any chest pain or shortness of breath symptoms since his last  angioplasty.  He denies fevers, chills, recent infections, chest pain, heart  palpations, shortness of breath, dyspnea on exertion, orthopnea, paroxysmal  nocturnal dyspnea, reflux symptoms, abdominal pain, nausea, vomiting,  diarrhea, constipation, hematemesis, hematochezia, melena, hematuria,  dysuria, or  nocturia, claudication, rest pain symptoms, lower extremity  edema, anxiety, depression, intolerance to heat or cold.   PHYSICAL EXAMINATION:  VITAL SIGNS:  Blood pressure 114/76, pulse 66 and  regular, respirations 16 and unlabored.  GENERAL:  Well developed, well nourished white male in no acute distress.  HEENT:  Normocephalic and atraumatic.  Pupils are equal, round, and reactive  to light and accommodation.  Extraocular movements intact.  He had contact  lenses in place.  Nares patent bilaterally.  TMs and canals are clear.  Oropharynx is clear with slightly dry mucous membranes and some evidence of  possible thrush on the mucosa.  He has upper and lower dentures in place.  NECK:  Supple without lymphadenopathy or thyromegaly.  He has a right  carotid bruit.  LUNGS:  Clear to auscultation.  HEART:  Regular rate and rhythm without murmurs, rubs, or gallops.  ABDOMEN:  Soft, nontender, nondistended, with active bowel sounds in all  quadrants.  No masses or hepatosplenomegaly.  EXTREMITIES:  No clubbing, cyanosis, or edema.  He has 2+ femoral, dorsalis  pedis, and posterior tibial pulses bilaterally.  NEUROLOGIC:  Cranial nerves II-XII grossly intact.  Gait within normal  limits.  Muscle strength 5+, and reflexes are within normal limits.   ASSESSMENT AND PLAN:  A 65 year old white male with asymptomatic right  internal carotid artery stenosis.  He will be admitted to [facility] on February 28, 2003, and undergo right carotid endarterectomy by Dr.  Liliane Bade.      Coral Ceo, P.A.                        Balinda Quails, M.D.    GC/MEDQ  D:  02/24/2003  T:  02/24/2003  Job:  [id number]   cc:   Reuben Likes, M.D.  [address]  [place]  [place] [address]  Fax: [phone]   Molli Hazard A. Kathrynn Running, M.D.  [address] [facility]  [place]  [place]  [address]  Fax: [phone]   Lyn Records III, M.D. [address]  [address]  [place]  [place] [address]  Fax:  [phone]

## 2010-12-06 NOTE — Op Note (Signed)
NAME:  Michael Holder, Michael Holder                        ACCOUNT NO.:  [id number]   MEDICAL RECORD NO.:  [id number]                   PATIENT TYPE:  AMB   LOCATION:  DSC                                  FACILITY:  [facility]   PHYSICIAN:  Kristine Garbe. Ezzard Standing, M.D.         DATE OF BIRTH:  03-05-1946   DATE OF PROCEDURE:  07/18/2002  DATE OF DISCHARGE:                                 OPERATIVE REPORT   PREOPERATIVE DIAGNOSIS:  Right superior jugular neck mass.   POSTOPERATIVE DIAGNOSIS:  Right superior jugular neck mass.   OPERATION PERFORMED:  Direct laryngoscopy with biopsy.  Excision of deep  high jugular neck mass on the right side.   SURGEON:  Kristine Garbe. Ezzard Standing, M.D.   ANESTHESIA:  General endotracheal.   COMPLICATIONS:  None.   INDICATIONS FOR PROCEDURE:  The patient is a 65 year old gentleman who has  had an apparent right neck mass now for over six weeks.  It has been  persistent over the last month and measures approximately 3 to 4 cm size, is  rotated just beneath the angle of the mandible on the right side.  Oral and  hypopharyngeal exam was otherwise normal to evaluation.  The mass was  nontender.  It is essentially unchanged over the last four weeks.  He is  taken to the operating room at this time for direct laryngoscopy, biopsy and  excision of right neck mass.   DESCRIPTION OF PROCEDURE:  After adequate endotracheal anesthesia, the  patient received 1 gm Ancef IV preoperatively.  First direct laryngoscopy  was performed.  The tonsil regions appeared benign.  There was no  ulceration, no gross mucosal abnormality.  The right tonsil was a little bit  larger than the left but this was minimal difference.  A biopsy was obtained  from the right tonsil and sent for frozen section.  Remaining direct  laryngoscopy was normal.  The base of tongue, vallecula, epiglottis were all  normal to evaluation but piriform sinuses were clear.  The AE folds, vocal  cords, epiglottis were  all normal to evaluation on laryngoscopy.  A frozen  section on the right tonsil biopsy was benign with no evidence of carcinoma.  The right neck was then prepped with Betadine solution and draped out with  sterile towels.  A horizontal incision was made just inferior to the angle  of the mandible directly over the right neck mass.  Dissection was carried  down deep to the latissimus muscle and the mass was noted along the jugular  chain of lymph nodes superiorly.  The mass was adherent to surrounding  connective tissue.  Careful dissection was performed with the cautery and  scissors.  The mass was adjacent to the jugular vein and extended up  superiorly up to the posterior aspect of the digastric muscle.  The mass  measured approximately 4 cm in size, was fairly firm and solid.  The  superior extent of the  mass was excised from its attachments and sent to  pathology.  Hemostasis was obtained with bipolar cautery.  Because of the  size of the mass, a quarter inch Penrose drain was placed and brought out  through the incision.  After obtaining adequate hemostasis, the wound was  irrigated with saline.  A Penrose drain was placed and the defect was closed  with 3-0 chromic suture subcutaneously and 5-0 nylon on the skin.  The  patient was subsequently awakened from anesthesia and transferred to the  recovery room postoperatively doing well.    DISPOSITION:  The patient is discharged to home later this morning on Keflex  500 mg b.i.d. for five days, on Vicodin p.r.n. pain, will have him follow up  in my office tomorrow to have the dressing changed and Penrose drain  removed.                                                Kristine Garbe. Ezzard Standing, M.D.    CEN/MEDQ  D:  07/18/2002  T:  07/18/2002  Job:  [id number]   cc:   Reuben Likes, M.D.  [address]  [place]  [place] [address]  Fax: [phone]

## 2010-12-06 NOTE — Assessment & Plan Note (Signed)
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  He has  been doing home __________ and working with therapy.  Pain is still an  issue with his left shoulder, particularly with sleeping and any type of  movements with the arm outstretched.  He rates is pain as a 5/10 and  describes it as tingling and aching.  He is still pending follow-up with  Dr. Channing Holder regarding his neck surgery, which may happen next month.  Dr.  Ranell Holder seems him for his shoulder and may want to perform an MRI.   Mood has been good.  He has been sleeping well.  He still requires a leg  bag for his urine.  He says his output is up and down.  He ran out of  Flomax a week or two ago.  His bowels have been moving daily with a  laxative.  The patient has not seen a urologist before but knows one  whom he can see.  The patient has run out of his other medications,  including Protonix, Florinef, Baclofen, Flomax, and oxycodone.   SOCIAL HISTORY:  Without significant change.  His wife remains  supportive.   REVIEW OF SYSTEMS:  Notable for the above.  A full review is in the  health and history section.   PHYSICAL EXAMINATION:  VITAL SIGNS:  Blood pressure 114/65, pulse of 60,  respiratory rate 16, saturating 98% on room air.  GENERAL:  The patient is pleasant, in no acute distress.  Alert and  oriented x3.  Affect is bright and appropriate.  Gait is stable.  CARDIAC:  Heart is regular.  CHEST:  Clear.  ABDOMEN:  Soft, nontender.  MUSCULOSKELETAL/NEUROLOGIC:  The patient is able to stand without  assistance.  Reflexes are 2+ in the lower extremities, 1+ in the upper  extremities.  Sensation is slightly decreased to pinprick and light  touch below the level of injury to 1+/2.  The left shoulder is notable  for rotator cuff weakness.  He had weakness throughout at 2/5 at the  biceps and triceps, 2 to 2+ at the left hand.  The right upper  extremity  is 1+ to 2+ throughout with movement of the shoulder noted.  The patient  had pain with cross arm maneuver as well as rotator cuff stressing and  impingement maneuvers.  Cognitively, the patient is intact.  Cranial  nerve exam is within normal limits.   ASSESSMENT:  1. Cervical spinal cord injury with central cord picture.  2. Left acromioclavicular joint separation.  3. Questionable left rotator cuff tear.  4. Multiple thoracic fractures.   PLAN:  1. Will await surgical plan from Dr. Channing Holder.  2. Could consider left shoulder injection.  Will stay with oxycodone      for breakthrough pain and Tylenol for now.  Continue range of      motion therapies with physical therapy.  The patient may try heat      as well for symptomatic relief.  3. Resume Flomax for bladder at 0.4 mg two tablets q.h.s.  Start      initially with one tablet when he resumes this.  4. Add Rozerem 8 mg q.h.s. for sleep.  5. Recommend urology follow-up for urinary retention as the patient      has a history of prostatic hypertrophy.  6. I will see the patient back in about 2 months' time.  Michael Holder, M.D.  Electronically Signed     ZTS/MedQ  D:  09/07/2006 12:25:51  T:  09/07/2006 14:06:30  Job #:  [id number]   cc:   Michael Holder, M.D.  Fax: [phone]

## 2010-12-06 NOTE — Consult Note (Signed)
   NAME:  Michael Holder, Michael Holder                        ACCOUNT NO.:  [id number]   MEDICAL RECORD NO.:  [id number]                   PATIENT TYPE:  INP   LOCATION:  NA                                   FACILITY:  [facility]   PHYSICIAN:  Mark E. Karin Golden, M.D.                DATE OF BIRTH:  27-Jan-1946   DATE OF CONSULTATION:  DATE OF DISCHARGE:                                   CONSULTATION   REASON FOR CONSULTATION:  This is a consultation for interpretation of  intracranial views from a cerebral arteriogram done February 08, 2003 by Dr.  Madilyn Fireman.   HISTORY:  A 65 year old with a history of neck cancer and radiation who  presents with a right internal carotid artery stenosis.   RIGHT INTERNAL CAROTID ARTERIOGRAM:  This vessel is opacified via a common  carotid artery injection. The carotid artery is widely patent into the brain  without siphon stenosis. Anterior and middle cerebral vessels appear normal  without stenosis, aneurysm, or vascular malformation. The venous and  parenchymal phases are abnormal.   LEFT INTERNAL CAROTID ARTERIOGRAM:  This vessel is opacified via a common  carotid artery injection. The internal carotid artery is widely patent into  the brain without siphon stenosis. There is a fetal origin of the posterior  cerebral artery which appears normal. The anterior and middle cerebral  vessels were likewise normal with no stenosis, aneurysm, or vascular  formation. The venous and parenchymal phases are normal.   IMPRESSION:  Normal intracranial vasculature.                                                 Mark E. Karin Golden, M.D.    MES/MEDQ  D:  02/14/2003  T:  02/15/2003  Job:  [id number]

## 2010-12-06 NOTE — Cardiovascular Report (Signed)
[place]  Patient:    Michael Holder, Michael Holder Visit Number: [id number] MRN: [id number]          Service Type: CAT Location: [facility] 2899 12 Attending Physician:  Lyn Records. Iii Dictated by:   Darci Needle, M.D. Proc. Date: 11/29/01 Admit Date:  11/29/2001 Discharge Date: 11/29/2001   CC:         Reuben Likes, M.D.   Cardiac Catheterization  INDICATIONS:  History of coronary artery disease, suffered a myocardial infarction 1988 undergoing angioplasty to the right coronary at that time. Recent development of exertional dyspnea and angina.  This study is being done to rule out evidence of progression of coronary artery disease.  PROCEDURE PERFORMED: 1. Left heart catheterization. 2. Selective coronary angiography. 3. Left ventriculography. 4. Percutaneous coronary intervention:  chronic total occlusion, left    circumflex and chronic total occlusion, right coronary.  DESCRIPTION OF PROCEDURE:  After informed consent, the patient was brought to the cardiac catheterization laboratory.  A #6 French sheath was inserted into the right femoral artery using the modified Seldinger technique.  A #6 French A-2 multipurpose catheter was used for hemodynamic recordings, left ventriculography by hand injection, and selective left and right coronary angiography.  After reviewing the coronary anatomy, it was felt that attempted angioplasty on the chronically totally occluded circumflex and right coronary was indicated.  IV heparin, 7000 units, was administered.  ACT was documented to be 307. Angioplasty was then attempted on the circumflex.  We used a 3.5 Voda.  We used a BMW wire, a luge wire, and a Cross-It XT 200 wire but were unable to cross the lesion.  We had a lot of difficulty entering the circumflex because of a right angle origin from the left main.  We then turned our attention to the chronically totally occluded right coronary.  We  used a BMW, luge, and Cross-It XT 200 wire.  We were able to penetrate the total occlusion several centimeters but were unable to get free wire position distally and after approximately 40 minutes of attempting, the case was terminated.  No complications occurred.  RESULTS:   I. Hemodynamic data      A. Aortic pressure 143/90 mmHg.      B. Left ventricular pressure 149/18.  II. Left ventriculography:  The left ventricle is normal in size.  There is      moderate inferior and inferobasal hypokinesis.  In fact, the inferobasal      region may be akinetic.  EF, however, is at least 50% and probably more      in the 60% range.  No significant MR is noted. III. Selective coronary angiography      A. Left main coronary:  Normal.      B. Left anterior descending coronary:  The left anterior descending         coronary artery is large and reaches the left ventricular apex.  It         gives collaterals to the circumflex and to the right coronary.         Several large diagonal branches arise from the left anterior         descending coronary.  No significant obstructions are noted in the         left coronary system.      C. Circumflex artery:  The circumflex coronary artery is totally occluded         after the origin of  a small first obtuse marginal.  Two large obtuse         marginal branches are seen to fill late by left-to-left collaterals         from the diagonal system of the LAD.      D. Right coronary:  The right coronary artery is totally occluded         proximally and fills late by collaterals via septal perforators from         the left coronary.  IV. Percutaneous coronary intervention      A. Total occlusion of the proximal right coronary:  Unable to cross/         unsuccessful.      B. Proximal occlusion of the circumflex:  Unable to cross/unsuccessful.  CONCLUSIONS: 1. Severe two-vessel coronary disease with total occlusion of the proximal    circumflex and right  coronary. 2. Unsuccessful attempted angioplasty of chronic total occlusion of the right    coronary and circumflex due to failure to cross. 3. Left ventricular dysfunction with inferobasal hypokinesis.  RECOMMENDATIONS:  Medical therapy.  If we are unable to control anginal symptoms with medications, will need to consider double-vessel bypass surgery. Dictated by:   Darci Needle, M.D. Attending Physician:  Lyn Records. Iii DD:  11/29/01 TD:  11/30/01 Job: [id number]

## 2010-12-06 NOTE — Consult Note (Signed)
NAME:  Michael Holder, Michael Holder                        ACCOUNT NO.:  [id number]   MEDICAL RECORD NO.:  [id number]                   PATIENT TYPE:  INP   LOCATION:  5711                                 FACILITY:  [facility]   PHYSICIAN:  Petra Kuba, M.D.                 DATE OF BIRTH:  12/04/1945   DATE OF CONSULTATION:  10/14/2002  DATE OF DISCHARGE:                                   CONSULTATION   HISTORY:  The patient is seen at the request of Dr. Lorenz Coaster for guaiac  positive anemia and possibly some hematemesis.  The patient was recently  diagnosed with squamous cell cancer of the neck of questionable etiology,  worked up by Radiology Oncology and Dr. Narda Bonds, and underwent  radiation therapy.  First had some teeth pulled and has lost about 40 pounds  over the last two months.  Since he finished radiation on Tuesday, he has  had some difficulty swallowing and some increased reflux and gagging ever  since the radiation with also some painful swallowing.  He had no GI  symptoms prior to starting radiation.  He is able to drink liquids but  really not eat any solids.  He has only moved his bowels a few times he says  since his radiation started.  He had no previous lower GI problems.  May  have had what sounds like a flex sig years ago but no other GI workup.  This  morning he did have a black bowel movement.  He has been on an aspirin a day  by Dr. Katrinka Blazing ever since a catheterization years ago.   PAST MEDICAL HISTORY:  1. Pertinent for the squamous cell neck cancer.  2. Coronary artery disease.  3. History of diabetes.  4. No other obvious problem.   ALLERGIES:  Pertinent for no allergies to any obvious medicines.   SOCIAL HISTORY:  Pertinent for no alcohol but he does take aspirin as above.   MEDICINES AT HOME:  Lipitor.   FAMILY HISTORY:  Pertinent for mother with ulcers but no other GI problems  in the family.   REVIEW OF SYSTEMS:  Negative except above.   PHYSICAL  EXAMINATION:  GENERAL:  The patient is in no acute distress sitting  comfortable in the chair.  LUNGS:  Clear.  HEART:  Regular rhythm and rate.  ABDOMEN:  Soft and nontender.  He was guaiac positive per Dr. Lorenz Coaster.   LABORATORY DATA:  Labs pertinent for hemoglobin of 10 with increased BUN.  Other labs only pertinent for platelet count of about 120.  His last  hemoglobin prior to radiation therapy was 17.   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   PLAN:  Risks, benefits, and methods of endoscopy were discussed.  Will  proceed with  that tomorrow unless needed sooner p.r.n. with further workup  and plans pending these findings.  The patient understands at some point he  will  need a colon screening test but probably would wait for him to improve and  get over the radiation unless endoscopy was totally normal.   Thank you very much for the consultation.                                               Petra Kuba, M.D.    MEM/MEDQ  D:  10/14/2002  T:  10/16/2002  Job:  [id number]   cc:   Reuben Likes, M.D.  [address]  [place]  [place] [address]  Fax: [phone]   Lyn Records III, M.D.  [address]  [place]  [place] [address]  Fax: [phone]

## 2010-12-06 NOTE — H&P (Signed)
[place]  Patient:    Michael Holder, Michael Holder Visit Number: [id number] MRN: [id number]          Service Type: CAT Location: [facility] 2899 12 Attending Physician:  Lyn Records. Iii Dictated by:   Anselm Lis, N.P. Admit Date:  11/29/2001 Discharge Date: 11/29/2001   CC:         Reuben Likes, M.D.   History and Physical  DATE OF BIRTH:  [birth year]  PRIMARY CARE Deryck Hippler:  Dr. Reuben Likes.  IMPRESSION:  (As dictated by Dr. Darci Needle.) 1. [age]-year-old diabetic male, history of dyslipidemia, hypertension and    obesity; history of inferior myocardial infarction in 1988 with subsequent    percutaneous transluminal coronary angioplasty of the right coronary artery    but reoccluded on followup cardiac catheterization some five days later    with good collaterals noted.  He had been treated medically.  A recent    stress Cardiolite revealed large areas of inferior and inferolateral    effusion superimposed on inferior/basal areas of old infarct.  The patient    has had episodes of exertional anterior chest tightness relieved with rest    starting about two weeks earlier.  He has not had to take sublingual    nitrates. 2. Elevated cholesterol, management by primary care on Lipitor. 3. Diabetes mellitus, type 2, and his glucose is 207 this morning. 4. Obesity.  PLAN:  (As dictated by Dr. Verdis Prime.)  Cardiac catheterization with coronary angiography and possible percutaneous intervention if indicated and able.  Risks, benefits, options and complications and alternatives to procedure discussed with the patient and his wife in detail.  Michael Holder indicates his questions and concerns have been addressed and is agreeable to proceed.  HISTORY OF PRESENT ILLNESS:  Michael Holder is a very pleasant 65 year old diabetic with known history of distally occluded RCA; prior inferior MI.  He has a history of diabetes, dyslipidemia and  obesity.  He underwent a Cardiolite recently revealing a large area of inferior/lateral ischemia superimposed on inferior/basal areas of old MI.  His EF was 67%.  He has noted over the last couple of weeks some exertional anterior tightness relieved with rest.  He now presents for coronary angiography and possible percutaneous intervention.   PREVIOUS MEDICAL HISTORY: 1. Coronary atherosclerotic heart disease.    A. (May 2003) Stress Cardiolite revealing large area of inferior and       inferolateral ischemia superimposed on inferior/basal areas of old MI.       EF of 67%.    B. (November 1999) GXT revealing inferior ischemia at peak exercise.       Changes similar to those found on GXT, November 1996; changes thought       secondary to known total occlusion of distal RCA.  He was treated       medically.    C. (1988) Inferior MI with subsequent PTCA of the RCA.  Did have subsequent       reocclusion of distal segment of RCA.  Followup cardiac catheterization       five days after MI revealed good collateral flow to left ventricular       branch and PDA of RCA from left system, 50% proximal circumflex, mild       inferior hypokinesis. 2. History of hypertension for the last 15 years. 3. History of dyslipidemia, management by primary M.D. 4. Diabetes mellitus, type 2, for about five  years. 5. Obesity.  SOCIAL HISTORY AND HABITS:  Tobacco use, quit May 1988 and had smoked about 2 packs per day prior to this for 20 years.  ETOH:  Negative.  Caffeine:  Not excessive.  Married for 27 years.  No kids.  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  INJURIES:  Larey Seat off a forklift in 1992 with subsequent fracture of his pelvis. Residual left hip pain.  PREVIOUS SURGICAL HISTORY:  Bilateral carpal tunnel repair; right elbow spur removal; tonsillectomy; history of cervical neck fusion, C5 and C6, anterior approach.  ALLERGIES:  No known drug allergies.  MEDICATIONS: 1.  Hyzaar 12.5 mg p.o. q.d. 2. Lipitor 20 mg per day. 3. Glyburide 5 mg p.o. q.d. 4. Enteric-coated aspirin 325 mg per day. 5. P.r.n. Advil.  FAMILY HISTORY:  Mother is age 83, alive and well.  Father died at age 42 of a stroke.  Four sisters and one brother; brother has diabetes.  No siblings with CAD.   REVIEW OF SYSTEMS:  As in HPI/previous medical history, otherwise, denies any problems with pedal edema, orthopnea, nor PND.  Negative claudication. Positive upper airway obstruction from sinus blockage from seasonal allergies. Denies problems with lightheadedness, dizziness, syncope nor near-syncopal spells.  Vision is okay, wears contacts and wears glasses.  Hearing okay.  He does have upper dentures and lower partials.  Rare episodes of GERD.  This morning, he feels a little stomach churning from nerves.  GI/GU:  Okay. Nocturia once nightly.  Has noted some shortness of breath with walking after 1200 to 1400 yards.  PHYSICAL EXAMINATION:  (As performed by Dr. Verdis Prime.)  VITAL SIGNS:  Blood pressure is 140/84, heart rate 77 and regular, respiratory rate 16, temperature 97.6.  He is 286 pounds, height 5 feet 10 inches.  GENERAL:  He is an obese, pleasantly conversant, middle-aged male in no apparent distress.  His wife is in attendance.  HEENT/NECK:  Brisk bilateral carotid upstrokes without bruit.  No significant JVD nor thyromegaly.  CHEST:  Lung sounds with equal bilateral excursion.  Negative CVA tenderness.  CARDIAC:  Regular rate and rhythm without murmur, rub nor gallop.  Normal S1 and S2.  ABDOMEN:  Soft, nondistended, normoactive bowel sounds.  Negative abdominal aortic, renal nor femoral bruit.  Nontender to applied pressure.  No masses nor organomegaly appreciated.  EXTREMITIES:  Distal pulses intact.  Negative pedal edema.  NEUROLOGIC:  Cranial nerves II-XII grossly intact, alert and oriented x3.  GENITORECTAL:  Exam was deferred.   LABORATORY TESTS AND  DATA:  EKG revealed NSR without ischemic changes.  Labs from today reveal sodium 140, potassium of 4.6, chloride 102, CO2 of 32, BUN of 18, creatinine 1.1, glucose 207; hemoglobin 15.2, hematocrit of 43.9, WBC of 6, platelets of 157,000; pro time of 12.4, INR of 0.8, PTT of 29.  Chest x-ray revealed no active disease, increased left hyperdensity with scar at left base. Dictated by:   Anselm Lis, N.P. Attending Physician:  Lyn Records. Iii DD:  11/29/01 TD:  11/30/01 Job: [id number]

## 2010-12-06 NOTE — Op Note (Signed)
NAME:  Michael Holder, Michael Holder                        ACCOUNT NO.:  [id number]   MEDICAL RECORD NO.:  [id number]                   PATIENT TYPE:  INP   LOCATION:  5711                                 FACILITY:  [facility]   PHYSICIAN:  Petra Kuba, M.D.                 DATE OF BIRTH:  [birth year]   DATE OF PROCEDURE:  10/15/2002  DATE OF DISCHARGE:                                 OPERATIVE REPORT   PROCEDURE:  Esophagogastroduodenoscopy with biopsy.   INDICATIONS:  Guaiac positivity, anemia, upper tract symptoms, getting neck  radiation, possibly some hematemesis.  Consent was signed after risks, benefits, methods, options thoroughly  discussed yesterday.   MEDICATIONS:  Demerol 30, Versed 4.   DESCRIPTION OF PROCEDURE:  The video endoscope was inserted by direct  vision.  The esophagus were normal.  There was no obvious radiation  problems.  There was a tiny hiatal hernia.  Scope passed into the stomach  and advanced to the antrum where an obvious moderately deep antral ulcer was  seen.  There was no mass lesion.  The scope passed through a normal pylorus  into a duodenal bulb pertinent for some mild bulbitis and around this area  to a normal second portion of the duodenum.  No blood was seen distally.  The scope was withdrawn back to the bulb and a good look there ruled out  active ulcers _________.  The scope was withdrawn back to the stomach and  retroflexed.  Angularis was okay.  Cardia and fundus were fine.  However,  along the lesser and greater curve was a moderate amount of linear  gastritis.  Scope was straightened and straight visualization in the stomach  confirmed the above.  No additional findings were seen.  We went ahead and  took one CLO-test of the antrum biopsy and then a few biopsies from the edge  of the ulcer and put in the first container and then we took  some biopsies  of the gastritis and put that in a second container.  The ulcer was washed  and watched.  It was  not bleeding post biopsies.  Air and water were  suctioned.  The scope was slowly withdrawn again and a good look at the  esophagus and a quick look at the posterior pharynx was normal.  The scope  was removed.  The patient tolerated the procedure well.  There was no  obvious immediate complications.   ENDOSCOPIC DIAGNOSES:  1. Tiny hiatal hernia with an essentially normal esophagus.  2. Moderately deep antral ulcer, status post biopsy.  3. Moderate gastritis and status post CLO-test biopsy.  4. Mild bulbitis.  5. Otherwise within normal limits esophagogastroduodenoscopy without signs     of active bleeding.   PLAN:  Pump inhibitors, probably long term, particularly if he will need to  go back to aspirin but no aspirin or nonsteroidals if possible until we  document healing.  Can go home later today if no delayed complications.  Will need a recheck EGD in two months, possibly even proceed with colon  screening at the same time.  Would go ahead and follow him to make sure his  hemoglobin comes up and would recheck guaiacs in a month.  I would be happy  to  see back p.r.n. Otherwise in one month in the office to set up the above.  We may want to use b.i.d. pump inhibitors for a month and then once a day  for two months or until we document healing.  If he does not go back on  aspirin or nonsteroidals, then could probably stop pump inhibitors at that  time but would use in the future to prevent an ulcer.                                                Petra Kuba, M.D.    MEM/MEDQ  D:  10/15/2002  T:  10/17/2002  Job:  [id number]   cc:   Artist Pais Kathrynn Running, M.D.  [address] [facility]  [place]  [place]  [address]  Fax: [phone]   Corwin Levins  [address]  [place]  [place] [address]  Fax: [phone]
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
## 2010-12-06 NOTE — Op Note (Signed)
NAME:  Michael, Holder                        ACCOUNT NO.:  [id number]   MEDICAL RECORD NO.:  [id number]                   PATIENT TYPE:  AMB   LOCATION:  DSC                                  FACILITY:  [facility]   PHYSICIAN:  Kristine Garbe. Ezzard Standing, M.D.         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   PROCEDURE:  Direct laryngoscopy, excisional biopsy of right tonsil.   SURGEON:  Kristine Garbe. Ezzard Standing, M.D.   ANESTHESIA:  General endotracheal.   COMPLICATIONS:  None.   INDICATIONS FOR PROCEDURE:  The patient is a 65 year old gentleman who  underwent excision of a deep right neck node one week ago and on final  pathology report, this demonstrated metastatic squamous cell carcinoma. He  is taken back to the operating room at this time for direct laryngoscopy and  biopsy in order to try to locate the primary lesion. On examination in the  office, the patient had a normal upper airway examination with normal  appearing tonsils.  Because of the location of the high jugular node from  metastatic squamous cell carcinoma, will plan excision of the right tonsil  to rule out any occult disease in the right tonsil region.   DESCRIPTION OF PROCEDURE:  After adequate general endotracheal, first mouth  gag was used to expose the oropharynx. The right tonsil was resected from  the tonsillar fossa using the cautery. There was no obvious ulceration or  mass within the right tonsil. It was slightly indurated, but again, no  discrete mass or nodule in the tonsil. The tonsil was excised and sent to  pathology.  Next, direct laryngoscopy was performed.  The A fold piriform  sinus regions on the right and left side were clear. Vocal  cords were normal  to examination.  Epiglottis was normal. Base of tongue and vallecular area  was all clear.  The soft palate was retracted and the nasopharynx was  examined with a mirror and the nasopharynx was clear of any significant mass  or abnormality noted.  Nasal examination was performed.  Nasal passages were  clear bilaterally with no obvious intranasal mass.  Base of tongue was soft,  no discrete nodules or induration.  Palpation of nasopharynx was normal.  Intraoral examination revealed normal appearing intraoral mucosa.  This  completed the direct laryngoscopy and excisional biopsy of the right tonsil.  The patient was awakened from anesthesia and transferred to the recovery  room postoperatively doing well.    DISPOSITION:  The patient is discharged home later this morning on  amoxicillin suspension 400 mg b.i.d. for one week, Tylenol or Lortab elixir  one to two tablespoons q.4h. p.r.n. pain.  Will have  him follow up in my  office next week to review pathology and plan further therapy.                                               Kristine Garbe. Ezzard Standing, M.D.    CEN/MEDQ  D:  07/29/2002  T:  07/29/2002  Job:  [id number]   cc:   Reuben Likes, M.D.  [address]  [place]  [place] [address]  Fax: [phone]
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
## 2010-12-06 NOTE — Op Note (Signed)
NAME:  Michael Holder, Michael Holder                        ACCOUNT NO.:  [id number]   MEDICAL RECORD NO.:  [id number]                   PATIENT TYPE:  INP   LOCATION:  3303                                 FACILITY:  [facility]   PHYSICIAN:  Balinda Quails, M.D.                 DATE OF BIRTH:  16-Dec-1945   DATE OF PROCEDURE:  02/28/2003  DATE OF DISCHARGE:                                 OPERATIVE REPORT   SURGEON:  Balinda Quails, M.D.   ASSISTANT:  Eber Hong, P.A.   ANESTHESIA:  General endotracheal anesthesia.   ANESTHESIOLOGIST:  Zenon Mayo, MD   PREOPERATIVE DIAGNOSIS:  Severe right internal carotid artery stenosis.   POSTOPERATIVE DIAGNOSIS:  Severe right internal carotid artery stenosis.   PROCEDURE:  Right carotid endarterectomy with Dacron patch angioplasty.   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  He underwent a  course of radiotherapy.  Following this, a CT scan was carried out which  revealed no evidence of tumor mass, however, significant stenosis of the  right internal carotid artery was identified.  Arteriography was carried out  and this verified a severe right internal carotid artery stenosis.  It was  recommended that the patient undergo right carotid endarterectomy for  reduction in stroke risk.  The operative risks including MI, CVA, cranial  nerve injury, and death of approximately 1-2% were explained, the patient  consented for surgery.   OPERATIVE PROCEDURE:  The patient was brought to the operating room in  stable condition.  He was placed in the supine position.  General  endotracheal anesthesia was induced.  Arterial line and Foley catheter were  placed.  The right neck was prepped and draped in a sterile fashion.  A  curvilinear skin incision was made along the anterior border of the right  sternocleidomastoid muscle.  The subcutaneous tissue and platysma  were  divided with electrocautery.  There were radiation changes with edema in the  subcutaneous tissue.  The tissues were more adherent and thick than usual.  Dissection was carried down along the anterior border of the  sternocleidomastoid muscle to the carotid sheath.  The ansa cervicalis was  reflected posteriorly.  The carotid bifurcation was exposed.  Of note, there  were no significant enlarged lymph nodes evident.  The common carotid artery  was freed down to the omohyoid muscle and encircled with a vessel loop.  Distal dissection carried up to the bifurcation where the superior thyroid  and external carotid were encircled with vessel loops.  The internal carotid  artery was followed distally.  The hypoglossal nerve was reflected  superiorly.  The vagus nerve was identified posteriorly and preserved.  The  distal internal carotid artery was encircled with a vessel loop.  The  carotid bifurcation revealed very focal plaque at the  origin of the right  internal carotid artery.  The patient was administered 7000 units of heparin  intravenously.  Adequate circulation time was permitted.  The carotid  vessels were controlled with clamps.  A longitudinal arteriotomy was made in  the distal common carotid artery.  The arteriotomy extended across the  carotid bulb and up into the internal carotid artery.  There was a very high  grade stenosis with a web like plaque at the origin of the right internal  carotid artery.  A shunt was inserted.  The plaque was removed with an  endarterectomy elevator.  The plaque was raised down into the common carotid  artery which was divided transversely with Potts scissors.  The plaque was  then raised up into the bulb of the superior thyroid and external carotid  were endarterectomized using an eversion technique.  The internal carotid  artery plaque was then feathered out distally.  The site was irrigated with  heparin saline solution.  A Finesse Dacron  patch was then placed over the  endarterectomy site with running 6-0 Prolene suture.  At the completion of  the patch angioplasty, the shunt was removed and all vessels were well  flushed.  The clamps were removed directing initial antegrade flow up the  external carotid artery, following this the internal carotid was released.  The patient was administered 50 mg protamine intravenously.  Adequate  hemostasis was obtained.  Sponge and instrument counts were correct.  There  was an excellent pulse and Doppler signal in the distal internal carotid  artery.  The sternocleidomastoid fascia was closed with running 2-0 Vicryl  suture.  The platysma was closed with running 2-0 Vicryl suture.  The skin  was closed with 4-0 Monocryl.  Steri-Strips were applied.  Anesthesia was  reversed.  The patient awakened readily, moved all extremities, and was at  neurologic baseline without apparent complications.  He was transferred to  the recovery room in stable condition.                                               Balinda Quails, M.D.    PGH/MEDQ  D:  02/28/2003  T:  02/28/2003  Job:  [id number]   cc:   Reuben Likes, M.D.  [address]  [place]  [place] [address]  Fax: [phone]   Molli Hazard A. Kathrynn Running, M.D.  [address] [facility]  [place]  [place]  [address]  Fax: [phone]   Kristine Garbe. Ezzard Standing, M.D.  [address]  [place] [address]  Fax: [phone]

## 2010-12-06 NOTE — Assessment & Plan Note (Signed)
Michael Holder is back regarding his spinal cord injury with central cord  picture.  I did receive his MRI which was performed on March 8th which  revealed degenerative disk changes at multiple levels of the cervical  spine with bulging disks and focal protrusion at C3-C4, C4-C5, and C6-  C7.  There was marked spinal canal stenosis and narrowing of the neural  foramina C3-C4 and C4-C5 with high signal representing myelomalacia.  He  has a subacute compression fracture T1-T2 with mild bulging disk.  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  The patient's neck pain has been a  bit better.  He still complains of weakness in both upper extremities.  PT and OT have been working on range of motion and mobility.  His  shoulders appear to be most problematic.  He is still waiting on a plan  from Dr. Ranell Patrick who is awaiting his eventual surgery on the neck.  His  cervical disk decompression is scheduled Nov 24, 2006.  MRI is pending  for the left shoulder.  The patient is resistant to using pain  medications per his wife.  He is in a lot of pain with any type of  movement.  He usually uses oxycodone only for sleep to improve that.  He  stopped taking the Rozerem as he did not find it helpful, and now feels  that he is sleeping better.  He has come off the Flomax as his bladder  has been emptying better.  He uses a leg bag while out and about to  avoid having to use public facilities.  The patient rates his pain  overall at 2 out of 10.  Describes his pain and symptoms as interfering  with general activity, relations with others, and enjoyment with life on  a moderate level.  Sleep is fair.   REVIEW OF SYSTEMS:  Notable for tingling and some limb swelling.  He  uses Isotoner gloves to help control hand swelling to a certain extent.  He wears a right hand orthosis at night to maintain position and range.   SOCIAL HISTORY:  Patient is married and wife is supportive.  She is with  him today as well as  his case Production designer, theatre/television/film.   PHYSICAL EXAMINATION:  Blood pressure is 111/73, pulse is 67,  respiratory rate is 16, he is satting 99% on room air.  Patient is pleasant, no acute distress.  He is alert and oriented x3.  Affect is generally bright and appropriate.  He walks for me without any difficulties today.  He remains limited with  the upper extremity range of motion.  The right wrist was resting in  neutral position, but had difficulty with passive movement past 20  degrees of extension.  Patient yelled in pain when I ranged the wrist  today.  He had some tightness along the flexor tendons.  Right elbow  moved without any resistance.  Right shoulder was tight and tenderness  was appreciated in the rotator cuff muscles.  He had a tight pectoralis  major, as well as latissimus dorsi muscle.  I was able to passively  abduct him to near 90 degrees with some discomfort.  He had tightness  with the rotation as well there.  Left shoulder is notable for ongoing  dislocation of the left clavicle, sitting on top of the acromion.  He  could tolerate minimal movement of the shoulder in any plane.  His  maximum movement was 10 to 20 degrees of  passive abduction, flexion,  extension and rotation.  He appears to have some muscle wasting as well  at the shoulder.  Left hand grip was fair at 3/5.  Elbow movement was 3+  out of 5.  He continues to have 1+ out of 2 sensation in both upper  extremities.  Cognitively, patient was appropriate.  Repeat cranial  nerve exam is intact.  Heart was regular.  Chest was clear.  ABDOMEN:  Soft, nontender.   ASSESSMENT:  1. The cervical central cord syndrome.  2. Left acromioclavicular separation.  3. Bilateral adhesive capsulitis with likely rotator cuff tendinitis      on the right, and probable left rotator cuff tear.  MRI pending.  4. History of multiple thoracic fractures.   PLAN:  1. Surgery scheduled Nov 24, 2006.  2. Will increase pain medication to allow  for more therapy tolerance.      Will ask patient to use an oxycodone 30 minutes prior to therapies      and stretching at home.  Also, will start him on Celebrex 200 mg      daily.  3. Await Dr. Ranell Patrick' plan for his shoulders.  For now I think he can      range as tolerated without doing any type of significant damage to      the shoulders.  He simply must range to his pain tolerance, which,      at this point is very      minimal.  4. I will see the patient back in about 6 weeks' time.      Ranelle Oyster, M.D.  Electronically Signed     ZTS/MedQ  D:  11/04/2006 13:19:12  T:  11/04/2006 14:23:41  Job #:  [id number]   cc:   Payton Doughty, M.D.  Fax: [phone]   Almedia Balls. Ranell Patrick, M.D.  Fax: [phone]   Julieanne Manson. Husbands, RN  [employer]  [id number]  [place], [place] [address]
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
## 2010-12-06 NOTE — Discharge Summary (Signed)
NAME:  Michael Holder, Michael Holder                        ACCOUNT NO.:  [id number]   MEDICAL RECORD NO.:  [id number]                   PATIENT TYPE:  INP   LOCATION:  3303                                 FACILITY:  [facility]   PHYSICIAN:  Balinda Quails, M.D.                 DATE OF BIRTH:  1945-10-03   DATE OF ADMISSION:  02/28/2003  DATE OF DISCHARGE:  03/01/2003                                 DISCHARGE SUMMARY   ADMISSION DIAGNOSES:  1. Severe right internal carotid artery stenosis greater than 80%     asymptomatic.  2. 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  3. Adult onset diabetes mellitus type 2, non-insulin-dependent, diet     controlled now.  4. Status post myocardial infarction 1988 with 3 percutaneous transluminal     coronary angioplasty interventions in the past.  5. Hypertension.  6. Dyslipidemia.   DISCHARGE DIAGNOSES:  1. Severe right internal carotid artery stenosis greater than 80%     asymptomatic.  2. 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  3. Adult onset diabetes mellitus type 2, non-insulin-dependent, diet     controlled now.  4. Status post myocardial infarction 1988 with 3 percutaneous transluminal     coronary angioplasty interventions in the past.  5. Hypertension.  6. Dyslipidemia.   PROCEDURE:  Right carotid endarterectomy with Dacron patch angioplasty on  February 28, 2003.   BRIEF HISTORY:  The patient is a 65 year old white male referred by Dr.  Lorenz Coaster for evaluation of carotid artery disease.  The patient has a history  of a right neck squamous cell carcinoma and recently underwent a CT scan to  evaluate the tumor.  An incidental finding included a narrowing of the right  ICA.  Referred to [facility] and Dr. Balinda Quails.  Dopplers confirmed this and  he subsequently underwent an arteriogram which showed greater than 80% right  carotid stenosis.  It was Dr. Madilyn Fireman opinion that the  patient should undergo  right carotid endarterectomy to prevent a stroke.   PAST MEDICAL HISTORY:  Significant for type 2 diabetes, currently not under  treatment.  Myocardial infarction in 1988.  He has had 3 subsequent PTCA  procedures. He has a history of hypercholesterolemia, hypertension, squamous  cell carcinoma of the right neck with radiation therapy, and a history of  peptic ulcer disease.   MEDICATIONS ON ADMISSION:  1. Lipitor 20 mg p.o. h.s.  2. Lopressor 50 mg p.o. b.i.d.  3. Aspirin 81 mg daily.  4. A multivitamin 1 daily.   ALLERGIES:  None known.   For further history and physical please see the dictated note.   HOSPITAL COURSE:  The patient was admitted and taken to the operating room,  on the day of admission, and underwent right carotid endarterectomy with  Dacron patch  angioplasty by Dr. Madilyn Fireman. He tolerated the procedure well and  returned to the recovery room and then 3300 in satisfactory condition,  neurologically intact.  His postoperative course every night was benign.  He  woke the following a.m. alert and neurologically intact. He was already  walking around the unit 4 to 5 times.  He is taking breakfast now.  His  chest is clear.  His incisions look good. He has only drained 10 cc over the  last 8 hours from the Jackson-Pratt drain in his right neck.  At this point  it was Dr. Madilyn Fireman' opinion that the patient was doing well.  His drain was  removed and he was subsequently scheduled for discharge later in the a.m. on  March 01, 2003.  He will return to see Dr. Madilyn Fireman on Monday August 30 at  9:40 a.m.   DISCHARGE MEDICATIONS:  He will resume his home medications as listed above  along with Tylox or Tylenol for pain as needed.   DISCHARGE ACTIVITY:  Light-to-moderate.  No lifting over 10 pounds.  No  driving.  No strenuous activity.   DISCHARGE LABS:  Hemoglobin was 12.2, hematocrit was 36.5, white count 6.9,  platelets 926,000.  Electrolytes are normal.   BUN is 8; creatinine is 1.1;  glucose is 119.   CONDITION ON DISCHARGE:  Improved.      Eber Hong, P.A.                 Balinda Quails, M.D.    WDJ/MEDQ  D:  03/01/2003  T:  03/02/2003  Job:  [id number]   cc:   Artist Pais Kathrynn Running, M.D.  [address]- [facility]  [place]  [place]  [address]  Fax: [phone]   Reuben Likes, M.D.  [address]  [place]  [place] [address]  Fax: [phone]   Garnette Scheuermann, M.D.

## 2010-12-11 ENCOUNTER — Other Ambulatory Visit: Payer: Self-pay

## 2010-12-11 ENCOUNTER — Other Ambulatory Visit (INDEPENDENT_AMBULATORY_CARE_PROVIDER_SITE_OTHER): Payer: Medicare Other

## 2010-12-11 DIAGNOSIS — I6529 Occlusion and stenosis of unspecified carotid artery: Secondary | ICD-10-CM

## 2010-12-25 NOTE — Procedures (Unsigned)
CAROTID DUPLEX EXAM  INDICATION:  Carotid endarterectomy.  HISTORY: Diabetes:  Yes. Cardiac:  MI, PTCA. Hypertension:  Yes. Smoking:  Previous. Previous Surgery:  Right carotid endarterectomy, 02/28/2003, done by Dr. Madilyn Fireman. CV History:  Currently asymptomatic. Amaurosis Fugax No, Paresthesias No, Hemiparesis No.                                      RIGHT             LEFT Brachial systolic pressure:         151               153 Brachial Doppler waveforms:         Normal            Normal Vertebral direction of flow:        Antegrade         Antegrade DUPLEX VELOCITIES (cm/sec) CCA peak systolic                   113               81 ECA peak systolic                   85                120 ICA peak systolic                   88                106 ICA end diastolic                   37                43 PLAQUE MORPHOLOGY:                  Mixed             Mixed PLAQUE AMOUNT:                      Mild              Mild PLAQUE LOCATION:                    ECA, bifurcation  ICA, ECA, CCA  IMPRESSION: 1. Bilateral internal carotid artery velocities suggest 1% to 39%     stenosis. 2. No significant changes from the previous examination.         ___________________________________________ Di Kindle. Edilia Bo, M.D.  EM/MEDQ  D:  12/12/2010  T:  12/12/2010  Job:  [id number]

## 2011-01-06 ENCOUNTER — Ambulatory Visit: Payer: Self-pay | Admitting: Family Medicine

## 2011-01-07 ENCOUNTER — Other Ambulatory Visit: Payer: Self-pay | Admitting: *Deleted

## 2011-01-07 MED ORDER — TAMSULOSIN HCL 0.4 MG PO CAPS: 0.4000 mg | ORAL_CAPSULE | Freq: Every day | ORAL | Status: DC

## 2011-01-07 NOTE — Telephone Encounter (Signed)
signed

## 2011-01-07 NOTE — Telephone Encounter (Signed)
Received fax from [facility] for a new rx. Fax to be filled out is on your desk.

## 2011-02-04 ENCOUNTER — Other Ambulatory Visit: Payer: Self-pay | Admitting: Family Medicine

## 2011-02-05 ENCOUNTER — Other Ambulatory Visit (INDEPENDENT_AMBULATORY_CARE_PROVIDER_SITE_OTHER): Payer: Medicare Other | Admitting: Family Medicine

## 2011-02-05 DIAGNOSIS — E119 Type 2 diabetes mellitus without complications: Secondary | ICD-10-CM

## 2011-02-05 LAB — MICROALBUMIN / CREATININE URINE RATIO
Creatinine,U: 76.6 mg/dL
Microalb, Ur: 1 mg/dL (ref 0.0–1.9)

## 2011-02-05 LAB — HEMOGLOBIN A1C: Hgb A1c MFr Bld: 6.7 % — ABNORMAL HIGH (ref 4.6–6.5)

## 2011-02-12 ENCOUNTER — Ambulatory Visit: Payer: Self-pay | Admitting: Family Medicine

## 2011-02-19 ENCOUNTER — Ambulatory Visit (INDEPENDENT_AMBULATORY_CARE_PROVIDER_SITE_OTHER): Payer: Medicare Other | Admitting: Family Medicine

## 2011-02-19 ENCOUNTER — Encounter: Payer: Self-pay | Admitting: Family Medicine

## 2011-02-19 DIAGNOSIS — E890 Postprocedural hypothyroidism: Secondary | ICD-10-CM

## 2011-02-19 DIAGNOSIS — E119 Type 2 diabetes mellitus without complications: Secondary | ICD-10-CM

## 2011-02-19 DIAGNOSIS — E785 Hyperlipidemia, unspecified: Secondary | ICD-10-CM

## 2011-02-19 DIAGNOSIS — I10 Essential (primary) hypertension: Secondary | ICD-10-CM

## 2011-02-19 NOTE — Patient Instructions (Signed)
F/u 6 months for a full CPX

## 2011-02-19 NOTE — Progress Notes (Signed)
Michael Holder, a 65 y.o. male presents today in the office for the following:    Started some back doing his exercises. Occ will lose balance.   Diabetes Mellitus: Tolerating Medications: Compliance with diet: Exercise: Avg blood sugars at home: Foot problems: none Hypoglycemia: none No nausea, vomitting, blurred vision, polyuria.  Lab Results  Component Value Date   HGBA1C 6.7* 02/05/2011    Basic Metabolic Panel:    Component Value Date/Time   NA 139 11/28/2010 1108   K 4.8 11/28/2010 1108   CL 103 11/28/2010 1108   CO2 32 11/28/2010 1108   BUN 14 11/28/2010 1108   CREATININE 1.2 11/28/2010 1108   GLUCOSE 105* 11/28/2010 1108   CALCIUM 9.1 11/28/2010 1108   Lipids: Doing well, stable. Tolerating meds fine with no SE. Panel reviewed with patient.  Lipids:    Component Value Date/Time   CHOL 145 07/03/2010 0824   TRIG 170.0* 07/03/2010 0824   HDL 30.90* 07/03/2010 0824   VLDL 34.0 07/03/2010 0824   CHOLHDL 5 07/03/2010 0824    Lab Results  Component Value Date   ALT 15 07/03/2010   AST 15 07/03/2010   ALKPHOS 64 07/03/2010   BILITOT 0.4 07/03/2010   Thyroid: No symptoms. Labs reviewed. Denies cold / heat intolerance, dry skin, hair loss. No goiter.  Lab Results  Component Value Date   TSH 6.38* 07/08/2010   HTN: Tolerating all medications without side effects Stable and at goal No CP, no sob. No HA.  BP Readings from Last 3 Encounters:  02/19/11 130/78  11/28/10 130/80  11/25/10 132/72    Basic Metabolic Panel:    Component Value Date/Time   NA 139 11/28/2010 1108   K 4.8 11/28/2010 1108   CL 103 11/28/2010 1108   CO2 32 11/28/2010 1108   BUN 14 11/28/2010 1108   CREATININE 1.2 11/28/2010 1108   GLUCOSE 105* 11/28/2010 1108   CALCIUM 9.1 11/28/2010 1108   The PMH, PSH, Social History, Family History, Medications, and allergies have been reviewed in [facility], and have been updated if relevant.  ROS: GEN: No acute illnesses, no fevers, chills. GI: No n/v/d,  eating normally Pulm: No SOB Interactive and getting along well at home.  Otherwise, ROS is as per the HPI.   Physical Exam  Blood pressure 130/78, pulse 71, temperature 97.8 F (36.6 C), temperature source Oral, height 5\' 10"  (1.778 m), weight 229 lb (103.874 kg), SpO2 98.00%.  GEN: WDWN, NAD, Non-toxic, A & O x 3 HEENT: Atraumatic, Normocephalic. Neck supple. No masses, No LAD. Ears and Nose: No external deformity. CV: RRR, No M/G/R. No JVD. No thrill. No extra heart sounds. PULM: CTA B, no wheezes, crackles, rhonchi. No retractions. No resp. distress. No accessory muscle use. ABD: S, NT, ND, +BS. No rebound tenderness. No HSM.  EXTR: No c/c/e NEURO Normal gait.  PSYCH: Normally interactive. Conversant. Not depressed or anxious appearing.  Calm demeanor.  Diabetic foot exam: Normal inspection No skin breakdown No calluses  Normal DP pulse baseline sensation to light tough Nails normal  1. DIABETES MELLITUS, TYPE II   2. HYPERLIPIDEMIA   3. HYPERTENSION   4. HYPOTHYROIDISM, POST-RADIATION    All stable. Keep all meds the same. Doing well overall with no complaints.

## 2011-04-09 ENCOUNTER — Encounter: Payer: Self-pay | Admitting: Family Medicine

## 2011-04-09 ENCOUNTER — Ambulatory Visit (INDEPENDENT_AMBULATORY_CARE_PROVIDER_SITE_OTHER): Payer: Medicare Other | Admitting: Family Medicine

## 2011-04-09 DIAGNOSIS — R109 Unspecified abdominal pain: Secondary | ICD-10-CM

## 2011-04-09 NOTE — Assessment & Plan Note (Signed)
Resolved since recent BM. Discussed taking Citrucel in 8 oz water for every tsp of Citrucel. Discussed regular activity/exercise as promoting peristalsis.

## 2011-04-09 NOTE — Patient Instructions (Signed)
Take Citrucel with water as discussed.

## 2011-04-09 NOTE — Progress Notes (Signed)
  Subjective:    Patient ID: Michael Holder, male    DOB: 1945/11/16, 65 y.o.   MRN: [id number]  HPI Pt here as acute appt for stomach cramps which began last night. At 1 AM had BM and hasn't had cramping since. His BMs are typically regular and he had one yesterday. He typically goes three to five days and then has BM. He has been diabetic for about ten years. He takes two teaspoons daily of Citrucel but in his coffee! He drinks fluids regularly. He has had throat cancer and has dry mouth and eczema.     Review of SystemsNo unusual foods prior to onset of sxs. Otherwise, noncontributory.     Objective:   Physical Exam  Constitutional: He appears well-developed and well-nourished. No distress.  HENT:  Head: Normocephalic and atraumatic.  Right Ear: External ear normal.  Left Ear: External ear normal.  Nose: Nose normal.  Mouth/Throat: Oropharynx is clear and moist.  Eyes: Conjunctivae and EOM are normal. Pupils are equal, round, and reactive to light. Right eye exhibits no discharge. Left eye exhibits no discharge.  Neck: Normal range of motion. Neck supple.       Postsurgical and postradiation changes of the neck area.  Cardiovascular: Normal rate and regular rhythm.   Pulmonary/Chest: Effort normal and breath sounds normal. He has no wheezes.  Abdominal: Soft. Bowel sounds are normal. He exhibits no distension and no mass. There is no tenderness. There is no rebound and no guarding.  Lymphadenopathy:    He has no cervical adenopathy.  Skin: He is not diaphoretic.          Assessment & Plan:

## 2011-04-15 LAB — URINALYSIS, ROUTINE W REFLEX MICROSCOPIC
Ketones, ur: NEGATIVE
Nitrite: NEGATIVE
Protein, ur: NEGATIVE
Urobilinogen, UA: 0.2

## 2011-04-15 LAB — CBC
HCT: 45.3
Hemoglobin: 15.3
MCHC: 34.4
MCV: 91.4
MCV: 91.5
Platelets: 147 — ABNORMAL LOW
RDW: 11.9
RDW: 12.7

## 2011-04-15 LAB — BASIC METABOLIC PANEL
BUN: 9
CO2: 29
Calcium: 8.4
Chloride: 100
Chloride: 105
Creatinine, Ser: 1.13
Glucose, Bld: 118 — ABNORMAL HIGH
Glucose, Bld: 95
Potassium: 4.4
Sodium: 141

## 2011-04-15 LAB — APTT: aPTT: 30

## 2011-04-15 LAB — DIFFERENTIAL
Eosinophils Absolute: 0.5
Eosinophils Relative: 8 — ABNORMAL HIGH
Lymphs Abs: 1.6
Monocytes Absolute: 0.5

## 2011-04-15 LAB — URINE MICROSCOPIC-ADD ON

## 2011-04-15 LAB — PROTIME-INR
INR: 0.9
Prothrombin Time: 12.4

## 2011-06-05 ENCOUNTER — Ambulatory Visit (INDEPENDENT_AMBULATORY_CARE_PROVIDER_SITE_OTHER): Payer: Medicare Other | Admitting: Family Medicine

## 2011-06-05 ENCOUNTER — Encounter: Payer: Self-pay | Admitting: Family Medicine

## 2011-06-05 DIAGNOSIS — R109 Unspecified abdominal pain: Secondary | ICD-10-CM | POA: Insufficient documentation

## 2011-06-05 NOTE — Progress Notes (Signed)
  Subjective:    Patient ID: Michael Holder, male    DOB: 12-21-1945, 65 y.o.   MRN: [id number]  HPI Pt of Dr Copland's I had seen approx two months ago as acute appt for stomach cramping who returns today again as an acute appt for sharp stomach pain. He describes this as pain that kept him awake all night. He used MOM yesterday for constipation and finally had a BM this AM with resolution of the pain. This routine is similar/the same as the last visit with sxs resolving with BM. He is walking regularly. He is now taking Citrucel one teaspoon in 8 oz of water daily. His bowel habits have begun normalizing and he believes he is making good progress. He made the appt this AM before the BM and resolution of sxs or he would not have called. He does feel his sxs are improving. He feels better and again has questions about using laxatives.    Review of SystemsNoncontributory except as above.       Objective:   Physical Exam  Constitutional: He appears well-developed and well-nourished. No distress.  HENT:  Head: Normocephalic and atraumatic.  Right Ear: External ear normal.  Left Ear: External ear normal.  Nose: Nose normal.  Mouth/Throat: Oropharynx is clear and moist.  Eyes: Conjunctivae and EOM are normal. Pupils are equal, round, and reactive to light. Right eye exhibits no discharge. Left eye exhibits no discharge.  Neck: Normal range of motion. Neck supple.  Cardiovascular: Normal rate and regular rhythm.   Pulmonary/Chest: Effort normal and breath sounds normal. He has no wheezes.  Abdominal: Soft. Bowel sounds are normal. He exhibits no distension. There is no tenderness. There is no rebound and no guarding.  Lymphadenopathy:    He has no cervical adenopathy.  Skin: He is not diaphoretic.          Assessment & Plan:

## 2011-06-05 NOTE — Patient Instructions (Signed)
Continue Citrucel, activity and as little laxative stimulation as possible to give the bowels time to normalize.

## 2011-06-05 NOTE — Assessment & Plan Note (Signed)
Resolved with BM today after MOM yesterday. We discussed the effect of Citrucel, fiber in general and activity like walking on the healthy normal bowel activity desired. The less pain medicine, the less laxative and the less artificial things needed to effect normal bowel activity and evacuation the better. With his normal bowel activity being every other day, would avoid laxative until no BM for 4 days or discomfort after three. Bowel activity should normalize as time goes on and less artificial stimulation is needed.

## 2011-06-13 ENCOUNTER — Encounter: Payer: Self-pay | Admitting: Family Medicine

## 2011-06-13 ENCOUNTER — Ambulatory Visit (INDEPENDENT_AMBULATORY_CARE_PROVIDER_SITE_OTHER): Payer: Medicare Other | Admitting: Family Medicine

## 2011-06-13 VITALS — BP 140/80 | HR 65 | Temp 97.6°F | Ht 70.0 in | Wt 222.0 lb

## 2011-06-13 DIAGNOSIS — R197 Diarrhea, unspecified: Secondary | ICD-10-CM

## 2011-06-13 DIAGNOSIS — R109 Unspecified abdominal pain: Secondary | ICD-10-CM

## 2011-06-13 LAB — CBC WITH DIFFERENTIAL/PLATELET
Basophils Absolute: 0 10*3/uL (ref 0.0–0.1)
Basophils Relative: 0 % (ref 0–1)
Eosinophils Absolute: 0.1 10*3/uL (ref 0.0–0.7)
Eosinophils Relative: 1 % (ref 0–5)
HCT: 45.8 % (ref 39.0–52.0)
Hemoglobin: 15.3 g/dL (ref 13.0–17.0)
Lymphocytes Relative: 8 % — ABNORMAL LOW (ref 12–46)
Lymphs Abs: 1 10*3/uL (ref 0.7–4.0)
MCH: 29.1 pg (ref 26.0–34.0)
MCHC: 33.4 g/dL (ref 30.0–36.0)
MCV: 87.2 fL (ref 78.0–100.0)
Monocytes Absolute: 1 10*3/uL (ref 0.1–1.0)
Monocytes Relative: 8 % (ref 3–12)
Neutro Abs: 10.4 10*3/uL — ABNORMAL HIGH (ref 1.7–7.7)
Neutrophils Relative %: 83 % — ABNORMAL HIGH (ref 43–77)
Platelets: 174 10*3/uL (ref 150–400)
RBC: 5.25 MIL/uL (ref 4.22–5.81)
RDW: 14.1 % (ref 11.5–15.5)
WBC: 12.5 10*3/uL — ABNORMAL HIGH (ref 4.0–10.5)

## 2011-06-13 LAB — COMPREHENSIVE METABOLIC PANEL
ALT: 13 U/L (ref 0–53)
AST: 14 U/L (ref 0–37)
Albumin: 4.2 g/dL (ref 3.5–5.2)
Alkaline Phosphatase: 75 U/L (ref 39–117)
BUN: 23 mg/dL (ref 6–23)
CO2: 22 mEq/L (ref 19–32)
Calcium: 9.1 mg/dL (ref 8.4–10.5)
Chloride: 96 mEq/L (ref 96–112)
Creat: 1.47 mg/dL — ABNORMAL HIGH (ref 0.50–1.35)
Glucose, Bld: 119 mg/dL — ABNORMAL HIGH (ref 70–99)
Potassium: 5.3 mEq/L (ref 3.5–5.3)
Sodium: 133 mEq/L — ABNORMAL LOW (ref 135–145)
Total Bilirubin: 0.8 mg/dL (ref 0.3–1.2)
Total Protein: 6.9 g/dL (ref 6.0–8.3)

## 2011-06-13 LAB — LIPASE: Lipase: 17 U/L (ref 0–75)

## 2011-06-13 NOTE — Patient Instructions (Signed)
Prevent dehydration: drink Gatorade, Pedialyte, Ginger Ale, popsicles  Immodium A-D over ther counter or Pepto-Bismol   day 1: clear liquids day 1: clear liquids--2-3 oz every 45-60 min. SMALL SIPS OR ICE CHIPS 7-up, ginger ale, sprite tea--no cofee chicken broth plain jello Water  Day 2: contnue clear liquids and add BRAT diet B--banana R--rice---can use chicken noodle or rice soup A--apple sauce T--dry toast GRITS, CREAM OF WHEAT, OATMEAL OK  Day 3: continue day 2 and add simple, non fat non spicy foods1 at a time to diet as canned peaches or pears backed or broiled chicken breast---or lunch meat boiled white potato-cook in chicken broth Chicken and rice or chicken noodles  

## 2011-06-13 NOTE — Progress Notes (Signed)
  Patient Name: Michael Holder Date of Birth: Jul 25, 1945 Age: 65 y.o. Medical Record Number: [id number] Gender: male  History of Present Illness:  [name] is a 65 y.o. very pleasant male patient who presents with the following:  Last night, took some milk of magnesia. Has either been runny or nothing. Has been a little nauseated. No fever, no chills. Belching over the past few days. Was having some upper abdominal pain.  Pleasant patient who has been seeing 2 prior times with some mild abdominal discomfort, and felt to be constipation, and his symptoms resolved when he took some milk of magnesia. The patient now has been having some abdominal pain over the last few days, and also has been having some nausea, vomiting, and also had some loose stool approximately 6 times in the past 24 hours. No blood or mucus in his stool. He is able to drink somewhat, but this is diminished compared to his prior intake. Minimal p.o. Solid intake.  No abd surgery Minimal sleep  Past Medical History, Surgical History, Social History, Family History, and Problem List have been reviewed in EHR and updated if relevant.  Review of Systems: ROS: GEN: Acute illness details above GI: Tolerating PO intake as above GU: maintaining adequate urination Pulm: No SOB Interactive and getting along well at home.  Otherwise, ROS is as per the HPI.   Physical Examination: Filed Vitals:   06/13/11 1146  BP: 140/80  Pulse: 65  Temp: 97.6 F (36.4 C)  TempSrc: Oral  Height: 5\' 10"  (1.778 m)  Weight: 222 lb (100.699 kg)     GEN: WDWN, NAD, Non-toxic, A & O x 3 HEENT: Atraumatic, Normocephalic. Neck supple. No masses, No LAD. Ears and Nose: No external deformity. CV: RRR, No M/G/R. No JVD. No thrill. No extra heart sounds. PULM: CTA B, no wheezes, crackles, rhonchi. No retractions. No resp. distress. No accessory muscle use. Abd: abdomen mildly tender in the upper quadrants. Hyperactive bowel sounds.  Nondistended. No rebound tenderness. No hepatosplenomegaly. Soft. EXTR: No c/c/e NEURO Normal gait.  PSYCH: Normally interactive. Conversant. Not depressed or anxious appearing.  Calm demeanor.    Assessment and Plan: 1. Abdominal  pain, other specified site  Comprehensive metabolic panel, CBC with Differential, Lipase  2. Diarrhea  Comprehensive metabolic panel, CBC with Differential, Lipase    Likely gastroenteritis, but the patient also has some persistent abdominal pain. Given that he has had some intermittent symptoms over the last few months, and would like to do a basic workup to ensure that he does not have any pancreatic dysfunction, liver dysfunction were otherwise abnormalities.  I would like to touch base with them after the weekend to ensure that he is improving. If his symptoms resolve, then we can feel reassured that this was purely gastroenteritis. If not, then further evaluation is needed.

## 2011-06-21 ENCOUNTER — Emergency Department (HOSPITAL_COMMUNITY): Payer: Medicare Other

## 2011-06-21 ENCOUNTER — Inpatient Hospital Stay (HOSPITAL_COMMUNITY)
Admission: EM | Admit: 2011-06-21 | Discharge: 2011-07-02 | DRG: 330 | Disposition: A | Discharge status: Home / Self Care | Payer: Medicare Other | Attending: Internal Medicine | Admitting: Internal Medicine

## 2011-06-21 ENCOUNTER — Encounter (HOSPITAL_COMMUNITY): Payer: Self-pay | Admitting: *Deleted

## 2011-06-21 DIAGNOSIS — R5383 Other fatigue: Secondary | ICD-10-CM

## 2011-06-21 DIAGNOSIS — E785 Hyperlipidemia, unspecified: Secondary | ICD-10-CM | POA: Diagnosis present

## 2011-06-21 DIAGNOSIS — I10 Essential (primary) hypertension: Secondary | ICD-10-CM | POA: Diagnosis present

## 2011-06-21 DIAGNOSIS — R042 Hemoptysis: Secondary | ICD-10-CM | POA: Diagnosis present

## 2011-06-21 DIAGNOSIS — I2510 Atherosclerotic heart disease of native coronary artery without angina pectoris: Secondary | ICD-10-CM | POA: Diagnosis present

## 2011-06-21 DIAGNOSIS — N9989 Other postprocedural complications and disorders of genitourinary system: Secondary | ICD-10-CM | POA: Diagnosis not present

## 2011-06-21 DIAGNOSIS — C183 Malignant neoplasm of hepatic flexure: Principal | ICD-10-CM | POA: Diagnosis present

## 2011-06-21 DIAGNOSIS — Z85819 Personal history of malignant neoplasm of unspecified site of lip, oral cavity, and pharynx: Secondary | ICD-10-CM

## 2011-06-21 DIAGNOSIS — R109 Unspecified abdominal pain: Secondary | ICD-10-CM | POA: Diagnosis present

## 2011-06-21 DIAGNOSIS — K6389 Other specified diseases of intestine: Secondary | ICD-10-CM

## 2011-06-21 DIAGNOSIS — K560 Paralytic ileus: Secondary | ICD-10-CM | POA: Diagnosis not present

## 2011-06-21 DIAGNOSIS — R112 Nausea with vomiting, unspecified: Secondary | ICD-10-CM | POA: Diagnosis present

## 2011-06-21 DIAGNOSIS — K5900 Constipation, unspecified: Secondary | ICD-10-CM | POA: Diagnosis not present

## 2011-06-21 DIAGNOSIS — IMO0002 Reserved for concepts with insufficient information to code with codable children: Secondary | ICD-10-CM | POA: Diagnosis not present

## 2011-06-21 DIAGNOSIS — Z87891 Personal history of nicotine dependence: Secondary | ICD-10-CM

## 2011-06-21 DIAGNOSIS — K929 Disease of digestive system, unspecified: Secondary | ICD-10-CM | POA: Diagnosis not present

## 2011-06-21 DIAGNOSIS — E1140 Type 2 diabetes mellitus with diabetic neuropathy, unspecified: Secondary | ICD-10-CM | POA: Diagnosis present

## 2011-06-21 DIAGNOSIS — E119 Type 2 diabetes mellitus without complications: Secondary | ICD-10-CM | POA: Diagnosis present

## 2011-06-21 DIAGNOSIS — R339 Retention of urine, unspecified: Secondary | ICD-10-CM | POA: Diagnosis not present

## 2011-06-21 DIAGNOSIS — C189 Malignant neoplasm of colon, unspecified: Secondary | ICD-10-CM

## 2011-06-21 DIAGNOSIS — K219 Gastro-esophageal reflux disease without esophagitis: Secondary | ICD-10-CM | POA: Diagnosis present

## 2011-06-21 HISTORY — DX: Malignant neoplasm of colon, unspecified: C18.9

## 2011-06-21 LAB — GLUCOSE, CAPILLARY
Glucose-Capillary: 139 mg/dL — ABNORMAL HIGH (ref 70–99)
Glucose-Capillary: 118 mg/dL — ABNORMAL HIGH (ref 70–99)

## 2011-06-21 LAB — POCT I-STAT, CHEM 8
BUN: 19 mg/dL (ref 6–23)
Calcium, Ion: 1.13 mmol/L (ref 1.12–1.32)
Chloride: 98 mEq/L (ref 96–112)
Creatinine, Ser: 1.3 mg/dL (ref 0.50–1.35)
Glucose, Bld: 156 mg/dL — ABNORMAL HIGH (ref 70–99)
TCO2: 27 mmol/L (ref 0–100)

## 2011-06-21 LAB — COMPREHENSIVE METABOLIC PANEL
ALT: 11 U/L (ref 0–53)
AST: 11 U/L (ref 0–37)
Albumin: 3.2 g/dL — ABNORMAL LOW (ref 3.5–5.2)
Alkaline Phosphatase: 67 U/L (ref 39–117)
BUN: 24 mg/dL — ABNORMAL HIGH (ref 6–23)
Chloride: 100 mEq/L (ref 96–112)
Potassium: 4 mEq/L (ref 3.5–5.1)
Sodium: 135 mEq/L (ref 135–145)
Total Bilirubin: 0.5 mg/dL (ref 0.3–1.2)
Total Protein: 6.3 g/dL (ref 6.0–8.3)

## 2011-06-21 LAB — CARDIAC PANEL(CRET KIN+CKTOT+MB+TROPI)
CK, MB: 5.6 ng/mL — ABNORMAL HIGH (ref 0.3–4.0)
Relative Index: INVALID (ref 0.0–2.5)
Troponin I: <0.3 ng/mL (ref <0.30)

## 2011-06-21 LAB — CBC
Platelets: 147 10*3/uL — ABNORMAL LOW (ref 150–400)
RBC: 4.45 MIL/uL (ref 4.22–5.81)
RDW: 13.7 % (ref 11.5–15.5)
WBC: 4.3 10*3/uL (ref 4.0–10.5)

## 2011-06-21 MED ORDER — PROMETHAZINE HCL 25 MG/ML IJ SOLN: 12.5000 mg | Freq: Four times a day (QID) | INTRAMUSCULAR | Status: DC | PRN

## 2011-06-21 MED ORDER — PANTOPRAZOLE SODIUM 40 MG IV SOLR: 40.0000 mg | Freq: Once | INTRAVENOUS | Status: DC

## 2011-06-21 MED ORDER — SODIUM CHLORIDE 0.9 % IV SOLN
INTRAVENOUS | Status: DC
  Administered 2011-06-21: 16:00:00 via INTRAVENOUS

## 2011-06-21 MED ORDER — FLEET ENEMA 7-19 GM/118ML RE ENEM
1.0000 | ENEMA | Freq: Once | RECTAL | Status: AC
  Administered 2011-06-21: 1 via RECTAL
  Filled 2011-06-21: qty 1

## 2011-06-21 MED ORDER — ACETAMINOPHEN 650 MG RE SUPP: 650.0000 mg | Freq: Four times a day (QID) | RECTAL | Status: DC | PRN

## 2011-06-21 MED ORDER — ACETAMINOPHEN 325 MG PO TABS: 650.0000 mg | ORAL_TABLET | Freq: Four times a day (QID) | ORAL | Status: DC | PRN

## 2011-06-21 MED ORDER — SENNA 8.6 MG PO TABS
1.0000 | ORAL_TABLET | Freq: Two times a day (BID) | ORAL | Status: DC
  Administered 2011-06-21 – 2011-06-24 (×5): 8.6 mg via ORAL
  Filled 2011-06-21 (×10): qty 1

## 2011-06-21 MED ORDER — IOHEXOL 300 MG/ML  SOLN: 119.0000 mL | Freq: Once | INTRAMUSCULAR | Status: AC | PRN

## 2011-06-21 MED ORDER — ALBUTEROL SULFATE (5 MG/ML) 0.5% IN NEBU
2.5000 mg | INHALATION_SOLUTION | RESPIRATORY_TRACT | Status: DC | PRN
  Filled 2011-06-21: qty 0.5

## 2011-06-21 MED ORDER — SODIUM CHLORIDE 0.9 % IV BOLUS (SEPSIS)
500.0000 mL | Freq: Once | INTRAVENOUS | Status: AC
  Administered 2011-06-21: 500 mL via INTRAVENOUS

## 2011-06-21 MED ORDER — SODIUM CHLORIDE 0.9 % IV SOLN
INTRAVENOUS | Status: DC
  Administered 2011-06-21: 75 mL/h via INTRAVENOUS
  Administered 2011-06-22 (×3): via INTRAVENOUS

## 2011-06-21 MED ORDER — ONDANSETRON HCL 4 MG/2ML IJ SOLN
4.0000 mg | Freq: Once | INTRAMUSCULAR | Status: AC
  Administered 2011-06-21: 4 mg via INTRAVENOUS
  Filled 2011-06-21: qty 2

## 2011-06-21 MED ORDER — MORPHINE SULFATE 4 MG/ML IJ SOLN
4.0000 mg | Freq: Once | INTRAMUSCULAR | Status: AC
  Administered 2011-06-21: 4 mg via INTRAVENOUS
  Filled 2011-06-21: qty 1

## 2011-06-21 MED ORDER — PANTOPRAZOLE SODIUM 40 MG IV SOLR
40.0000 mg | Freq: Two times a day (BID) | INTRAVENOUS | Status: DC
  Administered 2011-06-21 – 2011-06-22 (×3): 40 mg via INTRAVENOUS
  Filled 2011-06-21 (×5): qty 40

## 2011-06-21 MED ORDER — INSULIN ASPART 100 UNIT/ML ~~LOC~~ SOLN
0.0000 [IU] | Freq: Three times a day (TID) | SUBCUTANEOUS | Status: DC
  Administered 2011-06-22: 2 [IU] via SUBCUTANEOUS
  Filled 2011-06-21: qty 3

## 2011-06-21 MED ORDER — DOCUSATE SODIUM 100 MG PO CAPS
100.0000 mg | ORAL_CAPSULE | Freq: Two times a day (BID) | ORAL | Status: DC
  Administered 2011-06-21 – 2011-06-24 (×5): 100 mg via ORAL
  Filled 2011-06-21 (×7): qty 1

## 2011-06-21 MED ORDER — HYDROMORPHONE HCL PF 1 MG/ML IJ SOLN
1.0000 mg | INTRAMUSCULAR | Status: DC | PRN
  Administered 2011-06-21: 1 mg via INTRAVENOUS
  Filled 2011-06-21: qty 1

## 2011-06-21 MED ORDER — SODIUM CHLORIDE 0.9 % IV SOLN: Freq: Once | INTRAVENOUS | Status: DC

## 2011-06-21 MED ORDER — ENOXAPARIN SODIUM 40 MG/0.4ML ~~LOC~~ SOLN
40.0000 mg | SUBCUTANEOUS | Status: DC
  Administered 2011-06-21 – 2011-06-23 (×3): 40 mg via SUBCUTANEOUS
  Filled 2011-06-21 (×4): qty 0.4

## 2011-06-21 MED ORDER — TAMSULOSIN HCL 0.4 MG PO CAPS
0.4000 mg | ORAL_CAPSULE | Freq: Every day | ORAL | Status: DC
  Administered 2011-06-22 – 2011-06-24 (×3): 0.4 mg via ORAL
  Filled 2011-06-21 (×4): qty 1

## 2011-06-21 MED ORDER — HYDROMORPHONE HCL PF 1 MG/ML IJ SOLN: 0.5000 mg | INTRAMUSCULAR | Status: DC | PRN

## 2011-06-21 MED ORDER — LISINOPRIL 40 MG PO TABS
40.0000 mg | ORAL_TABLET | Freq: Every day | ORAL | Status: DC
  Administered 2011-06-21 – 2011-06-24 (×4): 40 mg via ORAL
  Filled 2011-06-21 (×4): qty 1

## 2011-06-21 MED ORDER — PROMETHAZINE HCL 25 MG PO TABS: 12.5000 mg | ORAL_TABLET | Freq: Four times a day (QID) | ORAL | Status: DC | PRN

## 2011-06-21 MED ORDER — ONDANSETRON HCL 4 MG/2ML IJ SOLN
4.0000 mg | Freq: Three times a day (TID) | INTRAMUSCULAR | Status: AC | PRN
  Administered 2011-06-21: 4 mg via INTRAVENOUS
  Filled 2011-06-21: qty 2

## 2011-06-21 MED ORDER — SIMVASTATIN 40 MG PO TABS
40.0000 mg | ORAL_TABLET | Freq: Every day | ORAL | Status: DC
  Administered 2011-06-21 – 2011-07-01 (×11): 40 mg via ORAL
  Filled 2011-06-21 (×14): qty 1

## 2011-06-21 NOTE — Progress Notes (Signed)
Admitted to 5156 from ED with family.  Oriented to room.

## 2011-06-21 NOTE — ED Notes (Signed)
Called floor to give report, spoke with unit clerk, nurse unavailable at this time, will return call.

## 2011-06-21 NOTE — ED Notes (Signed)
Patient transported to CT 
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
## 2011-06-21 NOTE — ED Notes (Signed)
Patient transported to X-ray 
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
## 2011-06-21 NOTE — ED Notes (Signed)
He was  Coughing up dark bloody sputum  pta this am.  He had  Some abd and chest cramping since thanksgiving intermittently.  Alert skin warm and dry  No distress.
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
## 2011-06-21 NOTE — ED Notes (Addendum)
Pt rang call light reporting that his Nausea and pain (7/10 in his abdomen)are returning. Medicated per order.

## 2011-06-22 LAB — CARDIAC PANEL(CRET KIN+CKTOT+MB+TROPI)
CK, MB: 4 ng/mL (ref 0.3–4.0)
CK, MB: 3.7 ng/mL (ref 0.3–4.0)
Relative Index: INVALID (ref 0.0–2.5)
Total CK: 95 U/L (ref 7–232)
Total CK: 120 U/L (ref 7–232)

## 2011-06-22 LAB — COMPREHENSIVE METABOLIC PANEL
AST: 12 U/L (ref 0–37)
Albumin: 3.2 g/dL — ABNORMAL LOW (ref 3.5–5.2)
Alkaline Phosphatase: 71 U/L (ref 39–117)
BUN: 24 mg/dL — ABNORMAL HIGH (ref 6–23)
Chloride: 99 mEq/L (ref 96–112)
Potassium: 4.1 mEq/L (ref 3.5–5.1)
Sodium: 134 mEq/L — ABNORMAL LOW (ref 135–145)
Total Bilirubin: 0.6 mg/dL (ref 0.3–1.2)
Total Protein: 6.6 g/dL (ref 6.0–8.3)

## 2011-06-22 LAB — GLUCOSE, CAPILLARY
Glucose-Capillary: 121 mg/dL — ABNORMAL HIGH (ref 70–99)
Glucose-Capillary: 105 mg/dL — ABNORMAL HIGH (ref 70–99)

## 2011-06-22 MED ORDER — INSULIN ASPART 100 UNIT/ML ~~LOC~~ SOLN: 0.0000 [IU] | SUBCUTANEOUS | Status: DC

## 2011-06-22 MED ORDER — DEXTROSE-NACL 5-0.9 % IV SOLN
INTRAVENOUS | Status: DC
  Administered 2011-06-23 – 2011-06-26 (×7): via INTRAVENOUS

## 2011-06-22 NOTE — Progress Notes (Signed)
Subjective: No further abdominal pain  Physical Exam: Blood pressure 81/51, pulse 102, temperature 98.6 F (37 C), temperature source Oral, resp. rate 18, height 5\' 10"  (1.778 m), weight 100.699 kg (222 lb), SpO2 95.00%. CVS: RRR RS: CTAB, soft - Nt, BS decreased Abdomen : tympanitic LE +1 edema       Basic Metabolic Panel:  Basename 06/22/11 0500 06/21/11 2205  NA 134* 135  K 4.1 4.0  CL 99 100  CO2 28 26  GLUCOSE 114* 129*  BUN 24* 24*  CREATININE 1.39* 1.23  CALCIUM 8.1* 8.0*  MG -- 1.9  PHOS -- --   Liver Function Tests:  Basename 06/22/11 0500 06/21/11 2205  AST 12 11  ALT 11 11  ALKPHOS 71 67  BILITOT 0.6 0.5  PROT 6.6 6.3  ALBUMIN 3.2* 3.2*     CBC:  Basename 06/21/11 2205 06/21/11 0827  WBC 4.3 --  NEUTROABS -- --  HGB 12.8* 16.3  HCT 38.1* 48.0  MCV 85.6 --  PLT 147* --    Ct Angio Chest W/cm &/or Wo Cm  06/21/2011  *RADIOLOGY REPORT*  Clinical Data:  Hemoptysis and hypoxia  CT ANGIOGRAPHY CHEST WITH CONTRAST  Technique:  Multidetector CT imaging of the chest was performed using the standard protocol during bolus administration of intravenous contrast.  Multiplanar CT image reconstructions including MIPs were obtained to evaluate the vascular anatomy.  Contrast:  119 ml Omni 300  Comparison:  November 10, 2005  Findings:  Biapical scarring is stable. Mild bibasilar pleural/ parenchymal scarring also appears unchanged. Otherwise, both lungs are clear.  There is no axillary, mediastinal, or hilar adenopathy. Mild cardiomegaly does not appear significantly changed.  There is a small hiatal hernia.  The bolus was not timed optimally for evaluation of pulmonary embolus but there are no gross filling defects within either pulmonary arterial system.  The thoracic aorta has a normal caliber and appearance.  Coronary artery and aortic atherosclerotic calcification is present.  There are no osseous lesions.  Review of the MIP images confirms the above findings.   IMPRESSION: There is stable pleural/ parenchymal scarring within both lungs. No acute abnormality.  Original Report Authenticated By: Brandon Melnick, M.D.   Ct Abdomen Pelvis W Contrast  06/21/2011  *RADIOLOGY REPORT*  Clinical Data: Abdominal bloating, vomiting, mid abdominal pain  CT ABDOMEN AND PELVIS WITH CONTRAST  Technique:  Multidetector CT imaging of the abdomen and pelvis was performed following the standard protocol during bolus administration of intravenous contrast.  Contrast:  119 ml Omni 300  Comparison: June 29, 2006  Findings: The stomach and small bowel are diffusely dilated extending to the terminal ileum.  The cecum and ascending colon are also fluid filled and dilated.  There is focal narrowing at the hepatic flexure but no gross associated soft tissue mass.  Dense opaque material is seen at the cecal level which may be related be related to prior appendectomy or small appendicoliths.  There is also a large stool burden at the rectum.  Left inguinal hernia is present which contains a loop of sigmoid colon.  This is nonobstructive.  There is descending and sigmoid diverticulosis without radiographic evidence of diverticulitis. There is no adenopathy within the abdomen or pelvis there is several small scattered lymph nodes within the mesentery.  No free fluid.  There is a small hiatal hernia.  The liver, gallbladder, spleen, pancreas, adrenal glands, left kidney, urinary bladder have a normal appearance.  There is a small cortical cyst on  the right kidney.  There are several diverticula at the base of the bladder. The old pelvic fractures are noticed with orthopedic fixation. Degenerative changes are present in the lower lumbar spine.  There are atherosclerotic calcifications within the aorta and takeoff vessels.  IMPRESSION: There is diffuse dilatation of the stomach, small bowel, and ascending colon with a focal narrowing and transition to normal caliber colon at the hepatic flexure level.   Even though there is no gross soft tissue mass, obstructing neoplasm cannot be excluded. Another consideration is an ischemic stricture, given the history of vascular disease in this patient.  Small hiatal hernia and nonobstructing left inguinal hernia.  Urinary bladder diverticula.  Original Report Authenticated By: Brandon Melnick, M.D.   Dg Abd Acute W/chest  06/21/2011  *RADIOLOGY REPORT*  Clinical Data: Abdominal bloating and hemoptysis  ACUTE ABDOMEN SERIES (ABDOMEN 2 VIEW & CHEST 1 VIEW)  Comparison: CT abdomen 06/29/2006  Findings: Normal cardiac silhouette.  Chronic elevation of the left hemidiaphragm.  Lungs are clear.  There are several dilated loops of small bowel centrally within the abdomen measuring up to 4 cm.  There are several short air fluid levels within the small bowel.  No free air beneath hemidiaphragms. There is stool in the descending colon.  There is a large stool ball within the rectum measuring 8.5 cm.  The stomach is gas distended.  IMPRESSION:  1.  Dilated loops of small bowel which short air fluid levels suggest ileus versus early mechanical obstruction. 2.  Large stool ball within the rectum.  Cannot exclude a distal obstruction.  Original Report Authenticated By: Genevive Bi, M.D.      Medications:  Scheduled:   . docusate sodium  100 mg Oral BID  . enoxaparin  40 mg Subcutaneous Q24H  . insulin aspart  0-15 Units Subcutaneous TID WC  . lisinopril  40 mg Oral Daily  . pantoprazole (PROTONIX) IV  40 mg Intravenous Once  . pantoprazole (PROTONIX) IV  40 mg Intravenous Q12H  . senna  1 tablet Oral BID  . simvastatin  40 mg Oral QHS  . Tamsulosin HCl  0.4 mg Oral QPC breakfast  . DISCONTD: sodium chloride   Intravenous Once  . DISCONTD: sodium chloride   Intravenous STAT    Impression:  Principal Problem:  *Abdominal pain Active Problems:  DIABETES MELLITUS, TYPE II  HYPERLIPIDEMIA  HYPERTENSION  CORONARY ARTERY DISEASE  GERD  Nausea and vomiting   Hemoptysis     Plan: Colonoscopy tomorrrow Will check AXR prior to procedure      LOS: 1 day   Reymond Maynez, MD Pager: [phone] 06/22/2011, 6:06 PM

## 2011-06-22 NOTE — Progress Notes (Signed)
Tap water enema given x1. (+) BM brownish, moderate amount 2x. Pt. Tolerated procedure well. Mechele Collin rn

## 2011-06-22 NOTE — Progress Notes (Signed)
Tap water enema given, as ordered.  Pt tolerated procedure well and was able to hold entire bag of water administered.  Pt returned a large amount of liquid, brown, watery stool.  Will cont to monitor.  Orva Riles, Annamaria Helling, RN

## 2011-06-22 NOTE — Progress Notes (Signed)
Pt given another tap water enema.  Pt tolerated procedure well and was able to retain the whole bag of water.  Pt returned large amount of watery stool, the color is still dark brown.  Will continue to monitor.  Reporting to next RN that patient still needs one more enema tonight.  Accalia Rigdon, Annamaria Helling , RN

## 2011-06-23 ENCOUNTER — Other Ambulatory Visit: Payer: Self-pay | Admitting: Gastroenterology

## 2011-06-23 ENCOUNTER — Encounter (HOSPITAL_COMMUNITY): Payer: Self-pay

## 2011-06-23 ENCOUNTER — Encounter (HOSPITAL_COMMUNITY)
Admission: EM | Disposition: A | Discharge status: Home / Self Care | Payer: Self-pay | Source: Home / Self Care | Attending: Internal Medicine

## 2011-06-23 ENCOUNTER — Inpatient Hospital Stay (HOSPITAL_COMMUNITY): Payer: Medicare Other

## 2011-06-23 DIAGNOSIS — K56609 Unspecified intestinal obstruction, unspecified as to partial versus complete obstruction: Secondary | ICD-10-CM

## 2011-06-23 HISTORY — PX: COLONOSCOPY: SHX5424

## 2011-06-23 LAB — GLUCOSE, CAPILLARY
Glucose-Capillary: 87 mg/dL (ref 70–99)
Glucose-Capillary: 85 mg/dL (ref 70–99)
Glucose-Capillary: 101 mg/dL — ABNORMAL HIGH (ref 70–99)
Glucose-Capillary: 111 mg/dL — ABNORMAL HIGH (ref 70–99)

## 2011-06-23 SURGERY — COLONOSCOPY
Anesthesia: Moderate Sedation

## 2011-06-23 MED ORDER — INSULIN ASPART 100 UNIT/ML ~~LOC~~ SOLN
0.0000 [IU] | Freq: Three times a day (TID) | SUBCUTANEOUS | Status: DC
  Administered 2011-06-23: 2 [IU] via SUBCUTANEOUS
  Administered 2011-06-24: 1 [IU] via SUBCUTANEOUS
  Administered 2011-06-25 – 2011-07-01 (×10): 2 [IU] via SUBCUTANEOUS

## 2011-06-23 MED ORDER — BIOTENE DRY MOUTH MT LIQD
15.0000 mL | Freq: Two times a day (BID) | OROMUCOSAL | Status: DC
  Administered 2011-06-23 – 2011-06-30 (×13): 15 mL via OROMUCOSAL

## 2011-06-23 MED ORDER — MIDAZOLAM HCL 5 MG/5ML IJ SOLN
INTRAMUSCULAR | Status: DC | PRN
  Administered 2011-06-23: 1 mg via INTRAVENOUS
  Administered 2011-06-23 (×2): 2 mg via INTRAVENOUS

## 2011-06-23 MED ORDER — POLYETHYLENE GLYCOL 3350 17 G PO PACK
17.0000 g | PACK | Freq: Every day | ORAL | Status: DC
  Administered 2011-06-23 – 2011-06-24 (×2): 17 g via ORAL
  Filled 2011-06-23 (×2): qty 1

## 2011-06-23 MED ORDER — FENTANYL CITRATE 0.05 MG/ML IJ SOLN
INTRAMUSCULAR | Status: AC
  Filled 2011-06-23: qty 2

## 2011-06-23 MED ORDER — PANTOPRAZOLE SODIUM 40 MG PO TBEC
40.0000 mg | DELAYED_RELEASE_TABLET | Freq: Every day | ORAL | Status: DC
  Administered 2011-06-23 – 2011-06-24 (×2): 40 mg via ORAL
  Filled 2011-06-23 (×2): qty 1

## 2011-06-23 MED ORDER — CHLORHEXIDINE GLUCONATE 0.12 % MT SOLN
15.0000 mL | Freq: Two times a day (BID) | OROMUCOSAL | Status: DC
  Administered 2011-06-23 – 2011-06-30 (×16): 15 mL via OROMUCOSAL
  Filled 2011-06-23 (×12): qty 15

## 2011-06-23 MED ORDER — MIDAZOLAM HCL 10 MG/2ML IJ SOLN
INTRAMUSCULAR | Status: AC
  Filled 2011-06-23: qty 2

## 2011-06-23 MED ORDER — FENTANYL NICU IV SYRINGE 50 MCG/ML
INJECTION | INTRAMUSCULAR | Status: DC | PRN
  Administered 2011-06-23 (×3): 25 ug via INTRAVENOUS

## 2011-06-23 NOTE — Progress Notes (Signed)
Tap water enema given. (+)BM large amount dark brown stool. Pt. Tolerated procedure well. Mechele Collin RN

## 2011-06-23 NOTE — Progress Notes (Addendum)
[facility] Progress Note  Subjective: The patient bloated and distended no nausea or vomiting  Objective: Vital signs in last 24 hours: Temp:  [97.6 F (36.4 C)-98.6 F (37 C)] 97.7 F (36.5 C) (12/03 1027) Pulse Rate:  [71-102] 71  (12/03 0634) Resp:  [15-23] 15  (12/03 1110) BP: (81-149)/(44-95) 87/44 mmHg (12/03 1110) SpO2:  [92 %-99 %] 96 % (12/03 1110) Weight change:    PE: Abdomen soft moderately distended  Lab Results: Results for orders placed during the hospital encounter of 06/21/11 (from the past 24 hour(s))  GLUCOSE, CAPILLARY     Status: Abnormal   Collection Time   06/22/11 12:27 PM      Component Value Range   Glucose-Capillary 105 (*) 70 - 99 (mg/dL)   Comment 1 Documented in Chart     Comment 2 Notify RN    GLUCOSE, CAPILLARY     Status: Normal   Collection Time   06/22/11  5:07 PM      Component Value Range   Glucose-Capillary 81  70 - 99 (mg/dL)   Comment 1 Documented in Chart     Comment 2 Notify RN    CARDIAC PANEL(CRET KIN+CKTOT+MB+TROPI)     Status: Abnormal   Collection Time   06/22/11  5:09 PM      Component Value Range   Total CK 120  7 - 232 (U/L)   CK, MB 3.7  0.3 - 4.0 (ng/mL)   Troponin I <0.30  <0.30 (ng/mL)   Relative Index 3.1 (*) 0.0 - 2.5   GLUCOSE, CAPILLARY     Status: Normal   Collection Time   06/22/11 10:21 PM      Component Value Range   Glucose-Capillary 77  70 - 99 (mg/dL)   Comment 1 Notify RN     Comment 2 Documented in Chart    GLUCOSE, CAPILLARY     Status: Normal   Collection Time   06/23/11  1:44 AM      Component Value Range   Glucose-Capillary 87  70 - 99 (mg/dL)   Comment 1 Notify RN     Comment 2 Documented in Chart    GLUCOSE, CAPILLARY     Status: Normal   Collection Time   06/23/11  4:14 AM      Component Value Range   Glucose-Capillary 85  70 - 99 (mg/dL)   Comment 1 Notify RN     Comment 2 Documented in Chart      Studies/Results: Dg Abd 1 View  06/23/2011  *RADIOLOGY REPORT*  Clinical  Data: Nausea vomiting.  Abdominal pain.  ABDOMEN - 1 VIEW  Comparison: 06/21/2011  Findings: Supine view of the abdomen shows interval decrease in gaseous small bowel distention.  There is more air now visible in the left colon.  IMPRESSION: Interval decreasing gaseous small bowel distention with slightly more gas visible in the left colon.  Original Report Authenticated By: ERIC A. MANSELL, M.D.   Colonoscopy. High grade obstruction with circumferential narrowing with distal margin not clearly malignant but hard stenotic narrowed lumen above it which could not be traversed but was biopsied at least moderately suspicious for malignancy versus atypical neoplasm or inflammatory/stenotic process. There was a lot of retained stool as only enemas were used for prep due to concerns about proximal obstruction.   Assessment: Circumferential narrowing of the ascending colon as mentioned above causing fairly high-grade obstructive symptoms  Plan: 1. Continue clear liquids only. 2. Obtain CEA level 3. Obtain surgical  consult 4. Consider gentle purge attempt from above with MiraLAX but will defer to surgery. 5. Await biopsy results    Michael Holder C 06/23/2011, 11:22 AM

## 2011-06-23 NOTE — Brief Op Note (Signed)
06/21/2011 - 06/23/2011  11:18 AM  PATIENT:  Michael Holder  65 y.o. male  PRE-OPERATIVE DIAGNOSIS:  Abd pain, Obstruction  POST-OPERATIVE DIAGNOSIS:  obstructed mass at hepatic flexure  PROCEDURE:  Procedure(s): COLONOSCOPY  SURGEON:  Surgeon(s): Barrie Folk, MD  Obstructing mass or narrowing at the hepatic flexure. Distal margin not clearly involved with tumor but hard stenotic narrowing proximal at least moderately suspicious for malignancy. Please see scanned formal report for images in more detail.

## 2011-06-23 NOTE — Consult Note (Signed)
Patient examined and I agree with the assessment and plan Await path and plan R colectomy later this week Michael Holder E

## 2011-06-23 NOTE — Progress Notes (Signed)
Subjective: No further abdominal pain. Back from colonoscopy   Physical Exam: Blood pressure 136/73, pulse 90, temperature 97.4 F (36.3 C), temperature source Oral, resp. rate 18, height 5\' 10"  (1.778 m), weight 100.699 kg (222 lb), SpO2 97.00%. CVS: RRR RS: CTAB, soft - Nt, BS diminished Abdomen : tympanitic LE +1 edema       Basic Metabolic Panel:  Basename 06/22/11 0500 06/21/11 2205  NA 134* 135  K 4.1 4.0  CL 99 100  CO2 28 26  GLUCOSE 114* 129*  BUN 24* 24*  CREATININE 1.39* 1.23  CALCIUM 8.1* 8.0*  MG -- 1.9  PHOS -- --   Liver Function Tests:  Basename 06/22/11 0500 06/21/11 2205  AST 12 11  ALT 11 11  ALKPHOS 71 67  BILITOT 0.6 0.5  PROT 6.6 6.3  ALBUMIN 3.2* 3.2*     CBC:  Basename 06/21/11 2205 06/21/11 0827  WBC 4.3 --  NEUTROABS -- --  HGB 12.8* 16.3  HCT 38.1* 48.0  MCV 85.6 --  PLT 147* --    Dg Abd 1 View  06/23/2011  *RADIOLOGY REPORT*  Clinical Data: Nausea vomiting.  Abdominal pain.  ABDOMEN - 1 VIEW  Comparison: 06/21/2011  Findings: Supine view of the abdomen shows interval decrease in gaseous small bowel distention.  There is more air now visible in the left colon.  IMPRESSION: Interval decreasing gaseous small bowel distention with slightly more gas visible in the left colon.  Original Report Authenticated By: ERIC A. MANSELL, M.D.      Medications:  Scheduled:    . antiseptic oral rinse  15 mL Mouth Rinse q12n4p  . chlorhexidine  15 mL Mouth/Throat BID  . docusate sodium  100 mg Oral BID  . enoxaparin  40 mg Subcutaneous Q24H  . insulin aspart  0-15 Units Subcutaneous TID WC  . lisinopril  40 mg Oral Daily  . pantoprazole  40 mg Oral Q1200  . polyethylene glycol  17 g Oral Daily  . senna  1 tablet Oral BID  . simvastatin  40 mg Oral QHS  . Tamsulosin HCl  0.4 mg Oral QPC breakfast  . DISCONTD: insulin aspart  0-15 Units Subcutaneous TID WC  . DISCONTD: insulin aspart  0-15 Units Subcutaneous Q4H  . DISCONTD:  pantoprazole (PROTONIX) IV  40 mg Intravenous Once  . DISCONTD: pantoprazole (PROTONIX) IV  40 mg Intravenous Q12H    Impression:  Principal Problem:  *Abdominal pain Active Problems:  DIABETES MELLITUS, TYPE II  HYPERLIPIDEMIA  HYPERTENSION  CORONARY ARTERY DISEASE  GERD  Nausea and vomiting  Hemoptysis     Plan: Noted plans to proceed with partial colectomy      LOS: 2 days   Khalel Alms, MD Pager: [phone] 06/23/2011, 5:18 PM
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
## 2011-06-24 LAB — GLUCOSE, CAPILLARY
Glucose-Capillary: 121 mg/dL — ABNORMAL HIGH (ref 70–99)
Glucose-Capillary: 116 mg/dL — ABNORMAL HIGH (ref 70–99)

## 2011-06-24 MED ORDER — SODIUM CHLORIDE 0.9 % IV SOLN
1.0000 g | Freq: Once | INTRAVENOUS | Status: AC
  Administered 2011-06-25: 1 g via INTRAVENOUS
  Filled 2011-06-24: qty 1

## 2011-06-24 MED ORDER — ALUM & MAG HYDROXIDE-SIMETH 200-200-20 MG/5ML PO SUSP
30.0000 mL | Freq: Four times a day (QID) | ORAL | Status: DC | PRN
  Administered 2011-06-26 – 2011-06-27 (×2): 30 mL via ORAL
  Filled 2011-06-24 (×3): qty 30

## 2011-06-24 MED ORDER — PANTOPRAZOLE SODIUM 40 MG IV SOLR
40.0000 mg | INTRAVENOUS | Status: DC
  Administered 2011-06-24 – 2011-06-29 (×5): 40 mg via INTRAVENOUS
  Filled 2011-06-24 (×7): qty 40

## 2011-06-24 NOTE — Progress Notes (Signed)
1 Day Post-Op  Subjective: Pt ok, feels gassy. Drank Miralax, not passing much through.  Objective: Vital signs in last 24 hours: Temp:  [97.4 F (36.3 C)-98.2 F (36.8 C)] 98.2 F (36.8 C) (12/04 0620) Pulse Rate:  [65-90] 65  (12/04 0620) Resp:  [13-40] 18  (12/04 0620) BP: (85-149)/(44-85) 133/71 mmHg (12/04 0620) SpO2:  [92 %-99 %] 96 % (12/04 0620) FiO2 (%):  [2 %] 2 % (12/04 0620) Last BM Date: 06/23/11  Intake/Output this shift:    Physical Exam: BP 133/71  Pulse 65  Temp(Src) 98.2 F (36.8 C) (Oral)  Resp 18  Ht 5\' 10"  (1.778 m)  Wt 222 lb (100.699 kg)  BMI 31.85 kg/m2  SpO2 96% Abdomen: remains soft, ND, few BS  Labs: CBC  Basename 06/21/11 2205  WBC 4.3  HGB 12.8*  HCT 38.1*  PLT 147*   BMET  Basename 06/22/11 0500 06/21/11 2205  NA 134* 135  K 4.1 4.0  CL 99 100  CO2 28 26  GLUCOSE 114* 129*  BUN 24* 24*  CREATININE 1.39* 1.23  CALCIUM 8.1* 8.0*   LFT  Basename 06/22/11 0500  PROT 6.6  ALBUMIN 3.2*  AST 12  ALT 11  ALKPHOS 71  BILITOT 0.6  BILIDIR --  IBILI --  LIPASE --   PT/INR No results found for this basename: LABPROT:2,INR:2 in the last 72 hours ABG No results found for this basename: PHART:2,PCO2:2,PO2:2,HCO3:2 in the last 72 hours  Studies/Results: Dg Abd 1 View  06/23/2011  *RADIOLOGY REPORT*  Clinical Data: Nausea vomiting.  Abdominal pain.  ABDOMEN - 1 VIEW  Comparison: 06/21/2011  Findings: Supine view of the abdomen shows interval decrease in gaseous small bowel distention.  There is more air now visible in the left colon.  IMPRESSION: Interval decreasing gaseous small bowel distention with slightly more gas visible in the left colon.  Original Report Authenticated By: ERIC A. MANSELL, M.D.   Pathology: TVA with foci concerning for malignancy Assessment: Principal Problem:  *Abdominal pain Active Problems:  DIABETES MELLITUS, TYPE II  HYPERLIPIDEMIA  HYPERTENSION  CORONARY ARTERY DISEASE  GERD  Nausea and  vomiting  Hemoptysis   Procedure(s): COLONOSCOPY  Plan: Continue clears, Miralax to gently prep. Plan for OR tomorrow or Thurs, need to look at schedule  LOS: 3 days    Michael Holder 06/24/2011

## 2011-06-24 NOTE — Progress Notes (Signed)
A lot of burping.  Path shows TVA with suspicion of adenocarcinoma.  Will proceed with R colectomy tomorrow.  I do not think patient will tolerate Nichol's prep ABX. Procedure/risks/benefits D/W patient and wife. He agrees. Patient examined and I agree with the assessment and plan  Michael Holder

## 2011-06-24 NOTE — Progress Notes (Signed)
Patient c/o of pain and bleeding from tip of penis. RN assessed the situation and found no blood in urine only coming from tip. Small amount of blood coming from irritation of catheter to penis tip. MD made aware with no new orders. RN will continue to monitor.

## 2011-06-24 NOTE — Progress Notes (Signed)
Subjective: Awaiting surgery . Again bloated after drinking MiraLAX and Jell-O and clear liquids. Passing very little gas and stool  Physical Exam: Blood pressure 133/71, pulse 65, temperature 98.2 F (36.8 C), temperature source Oral, resp. rate 18, height 5\' 10"  (1.778 m), weight 100.699 kg (222 lb), SpO2 96.00%. CVS: RRR RS: CTAB, soft - Nt, BS diminished Abdomen : tympanitic LE +1 edema       Basic Metabolic Panel:  Basename 06/22/11 0500 06/21/11 2205  NA 134* 135  K 4.1 4.0  CL 99 100  CO2 28 26  GLUCOSE 114* 129*  BUN 24* 24*  CREATININE 1.39* 1.23  CALCIUM 8.1* 8.0*  MG -- 1.9  PHOS -- --   Liver Function Tests:  Basename 06/22/11 0500 06/21/11 2205  AST 12 11  ALT 11 11  ALKPHOS 71 67  BILITOT 0.6 0.5  PROT 6.6 6.3  ALBUMIN 3.2* 3.2*     CBC:  Basename 06/21/11 2205  WBC 4.3  NEUTROABS --  HGB 12.8*  HCT 38.1*  MCV 85.6  PLT 147*    Dg Abd 1 View  06/23/2011  *RADIOLOGY REPORT*  Clinical Data: Nausea vomiting.  Abdominal pain.  ABDOMEN - 1 VIEW  Comparison: 06/21/2011  Findings: Supine view of the abdomen shows interval decrease in gaseous small bowel distention.  There is more air now visible in the left colon.  IMPRESSION: Interval decreasing gaseous small bowel distention with slightly more gas visible in the left colon.  Original Report Authenticated By: ERIC A. MANSELL, M.D.      Medications:  Scheduled:    . antiseptic oral rinse  15 mL Mouth Rinse q12n4p  . chlorhexidine  15 mL Mouth/Throat BID  . enoxaparin  40 mg Subcutaneous Q24H  . ertapenem  1 g Intravenous Once  . insulin aspart  0-15 Units Subcutaneous TID WC  . pantoprazole (PROTONIX) IV  40 mg Intravenous Q24H  . simvastatin  40 mg Oral QHS  . DISCONTD: docusate sodium  100 mg Oral BID  . DISCONTD: insulin aspart  0-15 Units Subcutaneous Q4H  . DISCONTD: lisinopril  40 mg Oral Daily  . DISCONTD: pantoprazole  40 mg Oral Q1200  . DISCONTD: pantoprazole (PROTONIX) IV  40  mg Intravenous Once  . DISCONTD: pantoprazole (PROTONIX) IV  40 mg Intravenous Q12H  . DISCONTD: polyethylene glycol  17 g Oral Daily  . DISCONTD: senna  1 tablet Oral BID  . DISCONTD: Tamsulosin HCl  0.4 mg Oral QPC breakfast    Impression:   65 year old gentleman admitted with increasing abdominal pain. CT scan on admission indicated a suspicion for a right flexure colonic mass almost completely obstructed. Patient underwent colonoscopy after he was prepped mainly with enemas. At colonoscopy a mass was visualized and was biopsied. Patient did not tolerate clear liquids and he was referred for surgery due to the high-grade obstructive colonic mass. He is awaiting partial colectomy for tomorrow\.  Plan: DC all meds for now except statin.    LOS: 3 days   Jaquay Morneault, MD Pager: [phone] 06/24/2011, 3:17 PM

## 2011-06-25 ENCOUNTER — Inpatient Hospital Stay (HOSPITAL_COMMUNITY): Payer: Medicare Other | Admitting: Anesthesiology

## 2011-06-25 ENCOUNTER — Encounter (HOSPITAL_COMMUNITY): Payer: Self-pay | Admitting: Anesthesiology

## 2011-06-25 ENCOUNTER — Encounter (HOSPITAL_COMMUNITY)
Admission: EM | Disposition: A | Discharge status: Home / Self Care | Payer: Self-pay | Source: Home / Self Care | Attending: Internal Medicine

## 2011-06-25 ENCOUNTER — Other Ambulatory Visit (INDEPENDENT_AMBULATORY_CARE_PROVIDER_SITE_OTHER): Payer: Self-pay | Admitting: General Surgery

## 2011-06-25 DIAGNOSIS — C189 Malignant neoplasm of colon, unspecified: Secondary | ICD-10-CM

## 2011-06-25 DIAGNOSIS — K6389 Other specified diseases of intestine: Secondary | ICD-10-CM | POA: Diagnosis present

## 2011-06-25 HISTORY — PX: PARTIAL COLECTOMY: SHX5273

## 2011-06-25 LAB — CBC
HCT: 37.8 % — ABNORMAL LOW (ref 39.0–52.0)
Hemoglobin: 12.2 g/dL — ABNORMAL LOW (ref 13.0–17.0)
MCV: 86.5 fL (ref 78.0–100.0)
RBC: 4.37 MIL/uL (ref 4.22–5.81)
RDW: 13.4 % (ref 11.5–15.5)
WBC: 8.1 10*3/uL (ref 4.0–10.5)

## 2011-06-25 LAB — BASIC METABOLIC PANEL
BUN: 4 mg/dL — ABNORMAL LOW (ref 6–23)
CO2: 26 mEq/L (ref 19–32)
Chloride: 104 mEq/L (ref 96–112)
Creatinine, Ser: 1.04 mg/dL (ref 0.50–1.35)
GFR calc Af Amer: 84 mL/min — ABNORMAL LOW (ref >90)
Glucose, Bld: 125 mg/dL — ABNORMAL HIGH (ref 70–99)
Potassium: 3.5 mEq/L (ref 3.5–5.1)

## 2011-06-25 LAB — GLUCOSE, CAPILLARY
Glucose-Capillary: 123 mg/dL — ABNORMAL HIGH (ref 70–99)
Glucose-Capillary: 130 mg/dL — ABNORMAL HIGH (ref 70–99)

## 2011-06-25 LAB — TYPE AND SCREEN

## 2011-06-25 SURGERY — COLECTOMY, PARTIAL
Anesthesia: General | Site: Abdomen | Laterality: Right | Wound class: Clean Contaminated

## 2011-06-25 MED ORDER — SODIUM CHLORIDE 0.9 % IJ SOLN: 9.0000 mL | INTRAMUSCULAR | Status: DC | PRN

## 2011-06-25 MED ORDER — HYDRALAZINE HCL 20 MG/ML IJ SOLN
10.0000 mg | Freq: Four times a day (QID) | INTRAMUSCULAR | Status: DC | PRN
  Administered 2011-07-01: 10 mg via INTRAVENOUS
  Filled 2011-06-25: qty 0.5

## 2011-06-25 MED ORDER — MIDAZOLAM HCL 2 MG/2ML IJ SOLN: 1.0000 mg | INTRAMUSCULAR | Status: DC | PRN

## 2011-06-25 MED ORDER — PROPOFOL 10 MG/ML IV EMUL
INTRAVENOUS | Status: DC | PRN
  Administered 2011-06-25: 180 mg via INTRAVENOUS

## 2011-06-25 MED ORDER — HYDROMORPHONE HCL PF 1 MG/ML IJ SOLN
0.2500 mg | INTRAMUSCULAR | Status: DC | PRN
  Administered 2011-06-25 (×2): 0.5 mg via INTRAVENOUS

## 2011-06-25 MED ORDER — FENTANYL CITRATE 0.05 MG/ML IJ SOLN: 1.0000 ug/kg | INTRAMUSCULAR | Status: DC | PRN

## 2011-06-25 MED ORDER — NALOXONE HCL 0.4 MG/ML IJ SOLN: 0.4000 mg | INTRAMUSCULAR | Status: DC | PRN

## 2011-06-25 MED ORDER — SODIUM CHLORIDE 0.9 % IR SOLN
Status: DC | PRN
  Administered 2011-06-25: 3000 mL

## 2011-06-25 MED ORDER — ONDANSETRON HCL 4 MG/2ML IJ SOLN
INTRAMUSCULAR | Status: DC | PRN
  Administered 2011-06-25: 4 mg via INTRAVENOUS

## 2011-06-25 MED ORDER — SUFENTANIL CITRATE 50 MCG/ML IV SOLN
INTRAVENOUS | Status: DC | PRN
  Administered 2011-06-25: 10 ug via INTRAVENOUS

## 2011-06-25 MED ORDER — NEOSTIGMINE METHYLSULFATE 1 MG/ML IJ SOLN
INTRAMUSCULAR | Status: DC | PRN
  Administered 2011-06-25: 5 mg via INTRAVENOUS

## 2011-06-25 MED ORDER — FENTANYL CITRATE 0.05 MG/ML IJ SOLN: 50.0000 ug | INTRAMUSCULAR | Status: DC | PRN

## 2011-06-25 MED ORDER — HYDROMORPHONE 0.3 MG/ML IV SOLN
INTRAVENOUS | Status: DC
  Administered 2011-06-25 (×2): 0.6 mg via INTRAVENOUS
  Administered 2011-06-25: 7.5 mg via INTRAVENOUS
  Administered 2011-06-26: 0.3 mg via INTRAVENOUS
  Administered 2011-06-26 (×2): 0.6 mg via INTRAVENOUS
  Administered 2011-06-26: 0.3 mg via INTRAVENOUS
  Administered 2011-06-27: 0.6 mg via INTRAVENOUS
  Administered 2011-06-27 (×2): 0.3 mg via INTRAVENOUS
  Administered 2011-06-27 (×2): 0.6 mg via INTRAVENOUS
  Administered 2011-06-28: 0.201 mg via INTRAVENOUS
  Administered 2011-06-28: 0.6 mg via INTRAVENOUS
  Administered 2011-06-28: 07:00:00 via INTRAVENOUS
  Administered 2011-06-28: 0.3 mg via INTRAVENOUS
  Administered 2011-06-28: 0.9 mg via INTRAVENOUS
  Administered 2011-06-29 (×2): 0.3 mg via INTRAVENOUS
  Filled 2011-06-25 (×2): qty 25

## 2011-06-25 MED ORDER — LORAZEPAM 2 MG/ML IJ SOLN: 1.0000 mg | Freq: Once | INTRAMUSCULAR | Status: DC | PRN

## 2011-06-25 MED ORDER — SODIUM CHLORIDE 0.9 % IV SOLN
1.0000 g | INTRAVENOUS | Status: DC
  Filled 2011-06-25: qty 1

## 2011-06-25 MED ORDER — ONDANSETRON HCL 4 MG/2ML IJ SOLN
4.0000 mg | Freq: Four times a day (QID) | INTRAMUSCULAR | Status: DC | PRN
  Filled 2011-06-25: qty 2

## 2011-06-25 MED ORDER — GLYCOPYRROLATE 0.2 MG/ML IJ SOLN
INTRAMUSCULAR | Status: DC | PRN
  Administered 2011-06-25: .5 mg via INTRAVENOUS

## 2011-06-25 MED ORDER — LACTATED RINGERS IV SOLN
INTRAVENOUS | Status: DC | PRN
  Administered 2011-06-25: 09:00:00 via INTRAVENOUS

## 2011-06-25 MED ORDER — METOPROLOL TARTRATE 1 MG/ML IV SOLN
2.5000 mg | Freq: Four times a day (QID) | INTRAVENOUS | Status: DC
  Administered 2011-06-25 – 2011-06-30 (×19): 2.5 mg via INTRAVENOUS
  Filled 2011-06-25 (×24): qty 5

## 2011-06-25 MED ORDER — SUCCINYLCHOLINE CHLORIDE 20 MG/ML IJ SOLN
INTRAMUSCULAR | Status: DC | PRN
  Administered 2011-06-25: 100 mg via INTRAVENOUS

## 2011-06-25 MED ORDER — ROCURONIUM BROMIDE 100 MG/10ML IV SOLN
INTRAVENOUS | Status: DC | PRN
  Administered 2011-06-25: 50 mg via INTRAVENOUS

## 2011-06-25 MED ORDER — DIPHENHYDRAMINE HCL 12.5 MG/5ML PO ELIX
12.5000 mg | ORAL_SOLUTION | Freq: Four times a day (QID) | ORAL | Status: DC | PRN
  Filled 2011-06-25: qty 5

## 2011-06-25 MED ORDER — MIDAZOLAM HCL 5 MG/5ML IJ SOLN
INTRAMUSCULAR | Status: DC | PRN
  Administered 2011-06-25: 2 mg via INTRAVENOUS

## 2011-06-25 MED ORDER — DIPHENHYDRAMINE HCL 50 MG/ML IJ SOLN: 12.5000 mg | Freq: Four times a day (QID) | INTRAMUSCULAR | Status: DC | PRN

## 2011-06-25 MED ORDER — PROMETHAZINE HCL 25 MG/ML IJ SOLN: 6.2500 mg | INTRAMUSCULAR | Status: DC | PRN

## 2011-06-25 SURGICAL SUPPLY — 55 items
BLADE SURG ROTATE 9660 (MISCELLANEOUS) IMPLANT
CANISTER SUCTION 2500CC (MISCELLANEOUS) ×2 IMPLANT
CHLORAPREP W/TINT 26ML (MISCELLANEOUS) ×2 IMPLANT
CLOTH BEACON ORANGE TIMEOUT ST (SAFETY) ×2 IMPLANT
COVER SURGICAL LIGHT HANDLE (MISCELLANEOUS) ×2 IMPLANT
DRAPE LAPAROSCOPIC ABDOMINAL (DRAPES) ×2 IMPLANT
DRAPE PROXIMA HALF (DRAPES) IMPLANT
DRAPE UTILITY 15X26 W/TAPE STR (DRAPE) ×4 IMPLANT
DRAPE WARM FLUID 44X44 (DRAPE) ×2 IMPLANT
ELECT BLADE 6.5 EXT (BLADE) ×2 IMPLANT
ELECT CAUTERY BLADE 6.4 (BLADE) ×4 IMPLANT
ELECT REM PT RETURN 9FT ADLT (ELECTROSURGICAL) ×2
ELECTRODE REM PT RTRN 9FT ADLT (ELECTROSURGICAL) ×1 IMPLANT
GAUZE SPONGE 4X4 12PLY STRL LF (GAUZE/BANDAGES/DRESSINGS) ×2 IMPLANT
GLOVE BIO SURGEON STRL SZ7 (GLOVE) ×6 IMPLANT
GLOVE BIO SURGEON STRL SZ7.5 (GLOVE) ×4 IMPLANT
GLOVE BIO SURGEON STRL SZ8 (GLOVE) ×4 IMPLANT
GLOVE BIOGEL PI IND STRL 7.5 (GLOVE) ×3 IMPLANT
GLOVE BIOGEL PI IND STRL 8 (GLOVE) ×1 IMPLANT
GLOVE BIOGEL PI INDICATOR 7.5 (GLOVE) ×3
GLOVE BIOGEL PI INDICATOR 8 (GLOVE) ×1
GOWN PREVENTION PLUS XLARGE (GOWN DISPOSABLE) ×2 IMPLANT
GOWN STRL NON-REIN LRG LVL3 (GOWN DISPOSABLE) ×4 IMPLANT
KIT BASIN OR (CUSTOM PROCEDURE TRAY) ×2 IMPLANT
KIT ROOM TURNOVER OR (KITS) ×2 IMPLANT
LEGGING LITHOTOMY PAIR STRL (DRAPES) IMPLANT
LIGASURE IMPACT 36 18CM CVD LR (INSTRUMENTS) ×2 IMPLANT
NS IRRIG 1000ML POUR BTL (IV SOLUTION) ×6 IMPLANT
PACK GENERAL/GYN (CUSTOM PROCEDURE TRAY) ×2 IMPLANT
PAD ARMBOARD 7.5X6 YLW CONV (MISCELLANEOUS) ×4 IMPLANT
RELOAD PROXIMATE 75MM BLUE (ENDOMECHANICALS) ×4 IMPLANT
SPECIMEN JAR X LARGE (MISCELLANEOUS) ×2 IMPLANT
SPONGE GAUZE 4X4 12PLY (GAUZE/BANDAGES/DRESSINGS) IMPLANT
SPONGE LAP 18X18 X RAY DECT (DISPOSABLE) IMPLANT
STAPLER GUN LINEAR PROX 60 (STAPLE) ×2 IMPLANT
STAPLER PROXIMATE 75MM BLUE (STAPLE) ×2 IMPLANT
STAPLER VISISTAT 35W (STAPLE) ×2 IMPLANT
SUCTION POOLE TIP (SUCTIONS) ×2 IMPLANT
SURGILUBE 2OZ TUBE FLIPTOP (MISCELLANEOUS) IMPLANT
SUT PDS AB 1 TP1 96 (SUTURE) ×4 IMPLANT
SUT PROLENE 2 0 CT2 30 (SUTURE) IMPLANT
SUT PROLENE 2 0 KS (SUTURE) IMPLANT
SUT SILK 2 0 SH CR/8 (SUTURE) ×2 IMPLANT
SUT SILK 2 0 TIES 10X30 (SUTURE) ×2 IMPLANT
SUT SILK 3 0 SH CR/8 (SUTURE) ×2 IMPLANT
SUT SILK 3 0 TIES 10X30 (SUTURE) ×2 IMPLANT
SUT VIC AB 3-0 SH 18 (SUTURE) IMPLANT
TAPE CLOTH SURG 4X10 WHT LF (GAUZE/BANDAGES/DRESSINGS) ×2 IMPLANT
TOWEL OR 17X24 6PK STRL BLUE (TOWEL DISPOSABLE) ×2 IMPLANT
TOWEL OR 17X26 10 PK STRL BLUE (TOWEL DISPOSABLE) ×2 IMPLANT
TRAY FOLEY CATH 14FRSI W/METER (CATHETERS) ×2 IMPLANT
TRAY PROCTOSCOPIC FIBER OPTIC (SET/KITS/TRAYS/PACK) IMPLANT
UNDERPAD 30X30 INCONTINENT (UNDERPADS AND DIAPERS) IMPLANT
WATER STERILE IRR 1000ML POUR (IV SOLUTION) IMPLANT
YANKAUER SUCT BULB TIP NO VENT (SUCTIONS) ×2 IMPLANT

## 2011-06-25 NOTE — Op Note (Signed)
06/21/2011 - 06/25/2011  11:22 AM  PATIENT:  Michael Holder  65 y.o. male  PRE-OPERATIVE DIAGNOSIS:  right colon mass  POST-OPERATIVE DIAGNOSIS:  right colon mass  PROCEDURE:  Procedure(s): PARTIAL COLECTOMY  SURGEON:  Surgeon(s): Liz Malady, MD Wilmon Arms. Corliss Skains, MD  PHYSICIAN ASSISTANT:   ASSISTANTS: Manus Rudd, md  ANESTHESIA:   general  EBL:  Total I/O In: 1000 [I.V.:1000] Out: 300 [Urine:300]  BLOOD ADMINISTERED:none  DRAINS: none   SPECIMEN:  Excision  DISPOSITION OF SPECIMEN:  PATHOLOGY  COUNTS:  YES  DICTATION: .Dragon Dictation patient was identified in the preop holding area. His site was marked. He received intravenous antibiotics. Informed consent had been obtained. He was brought to the operating room and general endotracheal anesthesia was administered by the anesthesia staff. His abdomen was prepped and draped in sterile fashion after placement of a Foley catheter by the nursing staff. We did a time out procedure. Midline incision was made subcutaneous tissues were dissected down revealing the anterior fascia. This was divided along the midline and the abdomen was entered under direct vision. The fascia was opened the length of the incision. Exploration revealed a palpable mass in the mid transverse colon. The right colon was then mobilized from its lateral peritoneal attachments. The hepatic flexure was mobilized. The colon was inspected and the area distal to the mass. A long region of margin was measured out and then the left transverse colon was divided with the GIA-75 stapler. The omentum was divided out from that portion. Next the mesentery was divided with the LigaSure and the middle colic vessels were taken between [facility] clamps. These were tied securely with silk sutures. The remaining mesentery was taken with LigaSure and suture ligation as needed. The terminal ileum was divided with a GIA-75 stapler. The remaining mesentery was taken and the  specimen was passed off. A side-to-side anastomosis was made between the terminal ileum and the left transverse colon. This was done with the GIA-75 stapler. There was no bleeding in the internal staple line. The enterotomy was closed with a TA 60 stapler. 2 crutch stitches of 2-0 silk were placed. The anastomosis was viable and palpably patent. We changed our gloves. The mesenteric defect was closed with some interrupted figure-of-eight 2-0 silk sutures. The abdomen was copiously irrigated. Counts were correct. Excellent hemostasis was obtained. Anastomosis was rechecked and remained completely viable. Bowel was returned to anatomic position. Fascia was closed with 2 lengths of running #1 looped PDS from each end and tied in the middle. Subcutaneous tissues were irrigated and closed with staples. All sponge needle and sponge counts were correct. There were no apparent complications. The patient was taken recovery in stable condition.  PATIENT DISPOSITION:  PACU - hemodynamically stable.   Delay start of Pharmacological VTE agent (>24hrs) due to surgical blood loss or risk of bleeding:  no  [name] E 12/5/201211:22 AM

## 2011-06-25 NOTE — Anesthesia Postprocedure Evaluation (Signed)
  Anesthesia Post-op Note  Patient: Michael Holder  Procedure(s) Performed:  PARTIAL COLECTOMY - right colectomy  Patient Location: PACU  Anesthesia Type: General  Level of Consciousness: awake  Airway and Oxygen Therapy: Patient Spontanous Breathing  Post-op Pain: mild  Post-op Assessment: Post-op Vital signs reviewed  Post-op Vital Signs: stable  Complications: No apparent anesthesia complications

## 2011-06-25 NOTE — Progress Notes (Signed)
PATIENT DETAILS Name: Michael Holder Age: 65 y.o. Sex: male Date of Birth: July 22, 1945 Admit Date: 06/21/2011 [id number] Copland, MD, MD  Subjective: Status post partial colectomy. Pain in the operative site.  Objective: Vital signs in last 24 hours: Filed Vitals:   06/25/11 1342 06/25/11 1602 06/25/11 1625 06/25/11 1631  BP: 136/81 185/113  168/109  Pulse: 72 102    Temp: 98 F (36.7 C) 99.4 F (37.4 C)    TempSrc: Oral Oral    Resp: 18 23 18    Height:      Weight:      SpO2: 96% 97% 98%     Weight change:   Body mass index is 31.85 kg/(m^2).  Intake/Output from previous day:  Intake/Output Summary (Last 24 hours) at 06/25/11 1804 Last data filed at 06/25/11 1124  Gross per 24 hour  Intake   2000 ml  Output   1650 ml  Net    350 ml    PHYSICAL EXAM: Gen Exam: Awake and alert with clear speech.   Neck: Supple, No JVD.   Chest: B/L Clear.   CVS: S1 S2 Regular, no murmurs.  Abdomen: soft, BS +, non tender, non distended. Dressing in place at the incision site. Currently dry Extremities: no edema, lower extremities warm to touch. Neurologic: Non Focal.   Skin: No Rash.   Wounds: N/A.    CONSULTS:  general surgery  LAB RESULTS: CBC  Lab 06/25/11 0802 06/21/11 2205 06/21/11 0827  WBC 8.1 4.3 --  HGB 12.2* 12.8* 16.3  HCT 37.8* 38.1* 48.0  PLT 122* 147* --  MCV 86.5 85.6 --  MCH 27.9 28.8 --  MCHC 32.3 33.6 --  RDW 13.4 13.7 --  LYMPHSABS -- -- --  MONOABS -- -- --  EOSABS -- -- --  BASOSABS -- -- --  BANDABS -- -- --    Chemistries   Lab 06/25/11 0802 06/22/11 0500 06/21/11 2205 06/21/11 0827  NA 138 134* 135 134*  K 3.5 4.1 4.0 4.8  CL 104 99 100 98  CO2 26 28 26  --  GLUCOSE 125* 114* 129* 156*  BUN 4* 24* 24* 19  CREATININE 1.04 1.39* 1.23 1.30  CALCIUM 8.0* 8.1* 8.0* --  MG -- -- 1.9 --    GFR Estimated Creatinine Clearance: 83.1 ml/min (by C-G formula based on Cr of 1.04).  Coagulation profile No results found for this  basename: INR:5,PROTIME:5 in the last 168 hours  Cardiac Enzymes  Lab 06/22/11 1709 06/22/11 1028 06/21/11 2206  CKMB 3.7 4.0 5.6*  TROPONINI <0.30 <0.30 <0.30  MYOGLOBIN -- -- --    No results found for this basename: POCBNP:3 in the last 168 hours No results found for this basename: DDIMER:2 in the last 72 hours No results found for this basename: HGBA1C:2 in the last 72 hours No results found for this basename: CHOL:2,HDL:2,LDLCALC:2,TRIG:2,CHOLHDL:2,LDLDIRECT:2 in the last 72 hours No results found for this basename: TSH,T4TOTAL,FREET3,T3FREE,THYROIDAB in the last 72 hours No results found for this basename: VITAMINB12:2,FOLATE:2,FERRITIN:2,TIBC:2,IRON:2,RETICCTPCT:2 in the last 72 hours No results found for this basename: LIPASE:2,AMYLASE:2 in the last 72 hours  Urine Studies No results found for this basename: UACOL:2,UAPR:2,USPG:2,UPH:2,UTP:2,UGL:2,UKET:2,UBIL:2,UHGB:2,UNIT:2,UROB:2,ULEU:2,UEPI:2,UWBC:2,URBC:2,UBAC:2,CAST:2,CRYS:2,UCOM:2,BILUA:2 in the last 72 hours  MICROBIOLOGY: No results found for this or any previous visit (from the past 240 hour(s)).  RADIOLOGY STUDIES/RESULTS: Dg Abd 1 View  06/23/2011  *RADIOLOGY REPORT*  Clinical Data: Nausea vomiting.  Abdominal pain.  ABDOMEN - 1 VIEW  Comparison: 06/21/2011  Findings: Supine view of the abdomen shows  interval decrease in gaseous small bowel distention.  There is more air now visible in the left colon.  IMPRESSION: Interval decreasing gaseous small bowel distention with slightly more gas visible in the left colon.  Original Report Authenticated By: ERIC A. MANSELL, M.D.   Ct Angio Chest W/cm &/or Wo Cm  06/21/2011  *RADIOLOGY REPORT*  Clinical Data:  Hemoptysis and hypoxia  CT ANGIOGRAPHY CHEST WITH CONTRAST  Technique:  Multidetector CT imaging of the chest was performed using the standard protocol during bolus administration of intravenous contrast.  Multiplanar CT image reconstructions including MIPs were obtained to  evaluate the vascular anatomy.  Contrast:  119 ml Omni 300  Comparison:  November 10, 2005  Findings:  Biapical scarring is stable. Mild bibasilar pleural/ parenchymal scarring also appears unchanged. Otherwise, both lungs are clear.  There is no axillary, mediastinal, or hilar adenopathy. Mild cardiomegaly does not appear significantly changed.  There is a small hiatal hernia.  The bolus was not timed optimally for evaluation of pulmonary embolus but there are no gross filling defects within either pulmonary arterial system.  The thoracic aorta has a normal caliber and appearance.  Coronary artery and aortic atherosclerotic calcification is present.  There are no osseous lesions.  Review of the MIP images confirms the above findings.  IMPRESSION: There is stable pleural/ parenchymal scarring within both lungs. No acute abnormality.  Original Report Authenticated By: Brandon Melnick, M.D.   Ct Abdomen Pelvis W Contrast  06/21/2011  *RADIOLOGY REPORT*  Clinical Data: Abdominal bloating, vomiting, mid abdominal pain  CT ABDOMEN AND PELVIS WITH CONTRAST  Technique:  Multidetector CT imaging of the abdomen and pelvis was performed following the standard protocol during bolus administration of intravenous contrast.  Contrast:  119 ml Omni 300  Comparison: June 29, 2006  Findings: The stomach and small bowel are diffusely dilated extending to the terminal ileum.  The cecum and ascending colon are also fluid filled and dilated.  There is focal narrowing at the hepatic flexure but no gross associated soft tissue mass.  Dense opaque material is seen at the cecal level which may be related be related to prior appendectomy or small appendicoliths.  There is also a large stool burden at the rectum.  Left inguinal hernia is present which contains a loop of sigmoid colon.  This is nonobstructive.  There is descending and sigmoid diverticulosis without radiographic evidence of diverticulitis. There is no adenopathy within the  abdomen or pelvis there is several small scattered lymph nodes within the mesentery.  No free fluid.  There is a small hiatal hernia.  The liver, gallbladder, spleen, pancreas, adrenal glands, left kidney, urinary bladder have a normal appearance.  There is a small cortical cyst on the right kidney.  There are several diverticula at the base of the bladder. The old pelvic fractures are noticed with orthopedic fixation. Degenerative changes are present in the lower lumbar spine.  There are atherosclerotic calcifications within the aorta and takeoff vessels.  IMPRESSION: There is diffuse dilatation of the stomach, small bowel, and ascending colon with a focal narrowing and transition to normal caliber colon at the hepatic flexure level.  Even though there is no gross soft tissue mass, obstructing neoplasm cannot be excluded. Another consideration is an ischemic stricture, given the history of vascular disease in this patient.  Small hiatal hernia and nonobstructing left inguinal hernia.  Urinary bladder diverticula.  Original Report Authenticated By: Brandon Melnick, M.D.   Dg Abd Acute W/chest  06/21/2011  *  RADIOLOGY REPORT*  Clinical Data: Abdominal bloating and hemoptysis  ACUTE ABDOMEN SERIES (ABDOMEN 2 VIEW & CHEST 1 VIEW)  Comparison: CT abdomen 06/29/2006  Findings: Normal cardiac silhouette.  Chronic elevation of the left hemidiaphragm.  Lungs are clear.  There are several dilated loops of small bowel centrally within the abdomen measuring up to 4 cm.  There are several short air fluid levels within the small bowel.  No free air beneath hemidiaphragms. There is stool in the descending colon.  There is a large stool ball within the rectum measuring 8.5 cm.  The stomach is gas distended.  IMPRESSION:  1.  Dilated loops of small bowel which short air fluid levels suggest ileus versus early mechanical obstruction. 2.  Large stool ball within the rectum.  Cannot exclude a distal obstruction.  Original Report  Authenticated By: Genevive Bi, M.D.    MEDICATIONS: Scheduled Meds:   . antiseptic oral rinse  15 mL Mouth Rinse q12n4p  . chlorhexidine  15 mL Mouth/Throat BID  . ertapenem  1 g Intravenous Once  . ertapenem  1 g Intravenous To OR  . HYDROmorphone PCA 0.3 mg/mL   Intravenous Q4H  . insulin aspart  0-15 Units Subcutaneous TID WC  . metoprolol  2.5 mg Intravenous Q6H  . pantoprazole (PROTONIX) IV  40 mg Intravenous Q24H  . simvastatin  40 mg Oral QHS  . DISCONTD: enoxaparin  40 mg Subcutaneous Q24H   Continuous Infusions:   . dextrose 5 % and 0.9% NaCl 100 mL/hr at 06/25/11 1624   PRN Meds:.acetaminophen, acetaminophen, albuterol, alum & mag hydroxide-simeth, diphenhydrAMINE, diphenhydrAMINE, hydrALAZINE, HYDROmorphone, naloxone, ondansetron (ZOFRAN) IV, promethazine, promethazine, sodium chloride, DISCONTD: fentaNYL, DISCONTD: fentaNYL, DISCONTD: HYDROmorphone, DISCONTD: LORazepam, DISCONTD: midazolam, DISCONTD: promethazine, DISCONTD: sodium chloride irrigation  Antibiotics: Anti-infectives     Start     Dose/Rate Route Frequency Ordered Stop   06/25/11 1000   ertapenem (INVANZ) 1 g in sodium chloride 0.9 % 50 mL IVPB        1 g 100 mL/hr over 30 Minutes Intravenous To Surgery 06/25/11 0956 06/26/11 1000   06/24/11 0900   ertapenem (INVANZ) 1 g in sodium chloride 0.9 % 50 mL IVPB        1 g 100 mL/hr over 30 Minutes Intravenous  Once 06/24/11 1313 06/25/11 1005          Assessment/Plan: Patient Active Hospital Problem List: Colonic mass    Assessment: Likely malignancy. Biopsy-suspicion for adenocarcinoma high appear.Now status post partial colectomy    Plan: Postop care per surgery   DIABETES MELLITUS, TYPE II    Assessment: CBGs stable    Plan: Continue with SSI. Resume metformin at discharge the  HYPERLIPIDEMIA    Assessment: Stable    Plan: Continue With Zocor.   HYPERTENSION    Assessment: Uncontrolled    Plan: N.p.o. post colectomy, will use scheduled  IV Lopressor for now. Would also use as needed hydralazine. Once by mouth intake resumes, we'll resume usual antihypertensive regimen.  CORONARY ARTERY DISEASE  Assessment: Seems to have tolerated colectomy without any issues.    Plan: We'll continue to monitor this issue closely. Resume aspirin once okay with surgery.   GERD    Assessment: Stable    Plan: Continue with PPI  Disposition: Remain inpatient.  DVT Prophylaxis: Bilateral SCDs for now.  Code Status: Full code.  Maretta Bees,  MD. 06/25/2011, 6:04 PM

## 2011-06-25 NOTE — Progress Notes (Signed)
Utilization review complete 

## 2011-06-25 NOTE — Anesthesia Preprocedure Evaluation (Addendum)
Anesthesia Evaluation  Patient identified by MRN, date of birth, ID band Patient awake    Reviewed: Allergy & Precautions, H&P , NPO status , Patient's Chart, lab work & pertinent test results  Airway Mallampati: II TM Distance: >3 FB Neck ROM: full    Dental   Pulmonary former smoker Quit May 1988   Pulmonary exam normal       Cardiovascular hypertension, Pt. 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 No s/s since   Neuro/Psych Negative Neurological ROS  Negative Psych ROS   GI/Hepatic PUD, GERD-  ,Gerd new symptom with addmission   Endo/Other  Diabetes mellitus-, Well Controlled, Type 2Hypothyroidism BS 122 my/dl...pt denies DM. Lost weight and is diet cont only  Renal/GU      Musculoskeletal   Abdominal   Peds  Hematology   Anesthesia Other Findings   Reproductive/Obstetrics                          Anesthesia Physical Anesthesia Plan  ASA: III  Anesthesia Plan: General   Post-op Pain Management:    Induction: Rapid sequence and Intravenous  Airway Management Planned: Oral ETT  Additional Equipment:   Intra-op Plan:   Post-operative Plan: Extubation in OR  Informed Consent: I have reviewed the patients History and Physical, chart, labs and discussed the procedure including the risks, benefits and alternatives for the proposed anesthesia with the patient or authorized representative who has indicated his/her understanding and acceptance.   Dental advisory given  Plan Discussed with: CRNA and Surgeon  Anesthesia Plan Comments:        Anesthesia Quick Evaluation

## 2011-06-25 NOTE — Transfer of Care (Signed)
Immediate Anesthesia Transfer of Care Note  Patient: Michael Holder  Procedure(s) Performed:  PARTIAL COLECTOMY - right colectomy  Patient Location: PACU  Anesthesia Type: General  Level of Consciousness: awake, alert , oriented and patient cooperative  Airway & Oxygen Therapy: Patient Spontanous Breathing and Patient connected to nasal cannula oxygen  Post-op Assessment: Report given to PACU RN, Post -op Vital signs reviewed and stable, Patient moving all extremities and Patient moving all extremities X 4  Post vital signs: Reviewed and stable  Complications: No apparent anesthesia complications

## 2011-06-25 NOTE — Progress Notes (Signed)
2 Days Post-Op  Subjective: BM and gas ovn  Objective: Vital signs in last 24 hours: Temp:  [98.2 F (36.8 C)-98.5 F (36.9 C)] 98.5 F (36.9 C) (12/05 0510) Pulse Rate:  [63-71] 63  (12/05 0510) Resp:  [17-18] 18  (12/05 0510) BP: (147-162)/(76-80) 159/80 mmHg (12/05 0510) SpO2:  [96 %-99 %] 97 % (12/05 0510) Last BM Date: 06/24/11  Intake/Output from previous day: 12/04 0701 - 12/05 0700 In: 760.8 [P.O.:160; I.V.:600.8] Out: 1951 [Urine:1950; Stool:1] Intake/Output this shift:    ABD softer, NT Exam OTW no change  Lab Results:  No results found for this basename: WBC:2,HGB:2,HCT:2,PLT:2 in the last 72 hours BMET No results found for this basename: NA:2,K:2,CL:2,CO2:2,GLUCOSE:2,BUN:2,CREATININE:2,CALCIUM:2 in the last 72 hours PT/INR No results found for this basename: LABPROT:2,INR:2 in the last 72 hours ABG No results found for this basename: PHART:2,PCO2:2,PO2:2,HCO3:2 in the last 72 hours  Studies/Results: No results found.  Anti-infectives: Anti-infectives     Start     Dose/Rate Route Frequency Ordered Stop   06/24/11 0900   ertapenem (INVANZ) 1 g in sodium chloride 0.9 % 50 mL IVPB        1 g 100 mL/hr over 30 Minutes Intravenous  Once 06/24/11 1313            Assessment/Plan: s/p Procedure(s): COLONOSCOPY R colon mass - likely CA For R colectomy this AM - Patient agreeable, see prev note Will check labs now  LOS: 4 days    Jarrah Seher E 06/25/2011

## 2011-06-26 ENCOUNTER — Encounter (HOSPITAL_COMMUNITY): Payer: Self-pay | Admitting: Gastroenterology

## 2011-06-26 LAB — GLUCOSE, CAPILLARY
Glucose-Capillary: 136 mg/dL — ABNORMAL HIGH (ref 70–99)
Glucose-Capillary: 108 mg/dL — ABNORMAL HIGH (ref 70–99)

## 2011-06-26 LAB — BASIC METABOLIC PANEL
BUN: 5 mg/dL — ABNORMAL LOW (ref 6–23)
Chloride: 106 mEq/L (ref 96–112)
GFR calc Af Amer: 86 mL/min — ABNORMAL LOW (ref >90)
GFR calc non Af Amer: 75 mL/min — ABNORMAL LOW (ref >90)
Glucose, Bld: 147 mg/dL — ABNORMAL HIGH (ref 70–99)
Potassium: 3.7 mEq/L (ref 3.5–5.1)
Sodium: 139 mEq/L (ref 135–145)

## 2011-06-26 LAB — CBC
HCT: 38.3 % — ABNORMAL LOW (ref 39.0–52.0)
Hemoglobin: 12.2 g/dL — ABNORMAL LOW (ref 13.0–17.0)
RDW: 13.7 % (ref 11.5–15.5)
WBC: 12.2 10*3/uL — ABNORMAL HIGH (ref 4.0–10.5)

## 2011-06-26 MED ORDER — SODIUM CHLORIDE 0.9 % IV SOLN
1.0000 g | Freq: Once | INTRAVENOUS | Status: AC
  Administered 2011-06-26: 1 g via INTRAVENOUS
  Filled 2011-06-26: qty 1

## 2011-06-26 MED ORDER — KCL IN DEXTROSE-NACL 20-5-0.45 MEQ/L-%-% IV SOLN
INTRAVENOUS | Status: DC
  Administered 2011-06-26: 1000 mL via INTRAVENOUS
  Administered 2011-06-26 – 2011-06-29 (×7): via INTRAVENOUS
  Filled 2011-06-26 (×10): qty 1000

## 2011-06-26 MED ORDER — ENOXAPARIN SODIUM 40 MG/0.4ML ~~LOC~~ SOLN
40.0000 mg | Freq: Every day | SUBCUTANEOUS | Status: DC
  Administered 2011-06-26 – 2011-07-02 (×7): 40 mg via SUBCUTANEOUS
  Filled 2011-06-26 (×7): qty 0.4

## 2011-06-26 NOTE — Progress Notes (Signed)
Burping a lot but no N/V Patient examined and I agree with the assessment and plan  Shadavia Dampier E

## 2011-06-26 NOTE — Progress Notes (Signed)
1 Day Post-Op  Subjective: Pt ok. Belching but no nausea. Sitting up in chair already. No flatus Pain control adequate  Objective: Vital signs in last 24 hours: Temp:  [97.6 F (36.4 C)-99.4 F (37.4 C)] 98 F (36.7 C) (12/06 0500) Pulse Rate:  [60-106] 96  (12/06 0500) Resp:  [16-30] 20  (12/06 0500) BP: (121-185)/(60-113) 147/83 mmHg (12/06 0500) SpO2:  [87 %-100 %] 98 % (12/06 0500) FiO2 (%):  [0 %] 0 % (12/05 1146) Last BM Date: 06/24/11  Intake/Output this shift:    Physical Exam: BP 147/83  Pulse 96  Temp(Src) 98 F (36.7 C) (Oral)  Resp 20  Ht 5\' 10"  (1.778 m)  Wt 222 lb (100.699 kg)  BMI 31.85 kg/m2  SpO2 98% Abdomen: mild distention, incision c/d/i Quiet BS  Labs: CBC  Basename 06/26/11 0640 06/25/11 0802  WBC 12.2* 8.1  HGB 12.2* 12.2*  HCT 38.3* 37.8*  PLT 139* 122*   BMET  Basename 06/26/11 0640 06/25/11 0802  NA 139 138  K 3.7 3.5  CL 106 104  CO2 28 26  GLUCOSE 147* 125*  BUN 5* 4*  CREATININE 1.02 1.04  CALCIUM 8.3* 8.0*   LFT No results found for this basename: PROT,ALBUMIN,AST,ALT,ALKPHOS,BILITOT,BILIDIR,IBILI,LIPASE in the last 72 hours PT/INR No results found for this basename: LABPROT:2,INR:2 in the last 72 hours ABG No results found for this basename: PHART:2,PCO2:2,PO2:2,HCO3:2 in the last 72 hours  Studies/Results: No results found.  Assessment: Principal Problem:  *Colonic mass Active Problems:  DIABETES MELLITUS, TYPE II  HYPERLIPIDEMIA  HYPERTENSION  CORONARY ARTERY DISEASE  GERD   Procedure(s): PARTIAL (R) COLECTOMY  Plan: DC Foley Encourage OOB Pain control. Await return of bowel fxn.  LOS: 5 days    Marianna Fuss 06/26/2011

## 2011-06-26 NOTE — Progress Notes (Signed)
PATIENT DETAILS Name: Michael Holder Age: 65 y.o. Sex: male Date of Birth: 05/02/1946 Admit Date: 06/21/2011 ZOX:[id number] Copland, MD, MD  Subjective: No major complaints. No Flatus yet  Objective: Vital signs in last 24 hours: Filed Vitals:   06/26/11 0345 06/26/11 0500 06/26/11 0825 06/26/11 1000  BP:  147/83  139/92  Pulse:  96  89  Temp:  98 F (36.7 C)  98.2 F (36.8 C)  TempSrc:  Oral  Oral  Resp: 20 20 20 18   Height:      Weight:      SpO2: 97% 98% 96% 99%    Weight change:   Body mass index is 31.85 kg/(m^2).  Intake/Output from previous day:  Intake/Output Summary (Last 24 hours) at 06/26/11 1202 Last data filed at 06/26/11 1030  Gross per 24 hour  Intake   3915 ml  Output   1300 ml  Net   2615 ml    PHYSICAL EXAM: Gen Exam: Awake and alert with clear speech.   Neck: Supple, No JVD.   Chest: B/L Clear.  No added sounds. CVS: S1 S2 Regular, no murmurs.  Abdomen: soft, BS +, non tender, non distended. Dressing in place at the incision site. Currently dry Extremities: no edema, lower extremities warm to touch. Neurologic: Non Focal.   Skin: No Rash.   Wounds: N/A.    CONSULTS:  general surgery  LAB RESULTS: CBC  Lab 06/26/11 0640 06/25/11 0802 06/21/11 2205 06/21/11 0827  WBC 12.2* 8.1 4.3 --  HGB 12.2* 12.2* 12.8* 16.3  HCT 38.3* 37.8* 38.1* 48.0  PLT 139* 122* 147* --  MCV 87.2 86.5 85.6 --  MCH 27.8 27.9 28.8 --  MCHC 31.9 32.3 33.6 --  RDW 13.7 13.4 13.7 --  LYMPHSABS -- -- -- --  MONOABS -- -- -- --  EOSABS -- -- -- --  BASOSABS -- -- -- --  BANDABS -- -- -- --    Chemistries   Lab 06/26/11 0640 06/25/11 0802 06/22/11 0500 06/21/11 2205 06/21/11 0827  NA 139 138 134* 135 134*  K 3.7 3.5 4.1 4.0 4.8  CL 106 104 99 100 98  CO2 28 26 28 26  --  GLUCOSE 147* 125* 114* 129* 156*  BUN 5* 4* 24* 24* 19  CREATININE 1.02 1.04 1.39* 1.23 1.30  CALCIUM 8.3* 8.0* 8.1* 8.0* --  MG -- -- -- 1.9 --    GFR Estimated Creatinine  Clearance: 84.7 ml/min (by C-G formula based on Cr of 1.02).  Coagulation profile No results found for this basename: INR:5,PROTIME:5 in the last 168 hours  Cardiac Enzymes  Lab 06/22/11 1709 06/22/11 1028 06/21/11 2206  CKMB 3.7 4.0 5.6*  TROPONINI <0.30 <0.30 <0.30  MYOGLOBIN -- -- --    No results found for this basename: POCBNP:3 in the last 168 hours No results found for this basename: DDIMER:2 in the last 72 hours No results found for this basename: HGBA1C:2 in the last 72 hours No results found for this basename: CHOL:2,HDL:2,LDLCALC:2,TRIG:2,CHOLHDL:2,LDLDIRECT:2 in the last 72 hours No results found for this basename: TSH,T4TOTAL,FREET3,T3FREE,THYROIDAB in the last 72 hours No results found for this basename: VITAMINB12:2,FOLATE:2,FERRITIN:2,TIBC:2,IRON:2,RETICCTPCT:2 in the last 72 hours No results found for this basename: LIPASE:2,AMYLASE:2 in the last 72 hours  Urine Studies No results found for this basename: UACOL:2,UAPR:2,USPG:2,UPH:2,UTP:2,UGL:2,UKET:2,UBIL:2,UHGB:2,UNIT:2,UROB:2,ULEU:2,UEPI:2,UWBC:2,URBC:2,UBAC:2,CAST:2,CRYS:2,UCOM:2,BILUA:2 in the last 72 hours  MICROBIOLOGY: No results found for this or any previous visit (from the past 240 hour(s)).  RADIOLOGY STUDIES/RESULTS: Dg Abd 1 View  06/23/2011  *RADIOLOGY REPORT*  Clinical Data: Nausea vomiting.  Abdominal pain.  ABDOMEN - 1 VIEW  Comparison: 06/21/2011  Findings: Supine view of the abdomen shows interval decrease in gaseous small bowel distention.  There is more air now visible in the left colon.  IMPRESSION: Interval decreasing gaseous small bowel distention with slightly more gas visible in the left colon.  Original Report Authenticated By: ERIC A. MANSELL, M.D.   Ct Angio Chest W/cm &/or Wo Cm  06/21/2011  *RADIOLOGY REPORT*  Clinical Data:  Hemoptysis and hypoxia  CT ANGIOGRAPHY CHEST WITH CONTRAST  Technique:  Multidetector CT imaging of the chest was performed using the standard protocol during  bolus administration of intravenous contrast.  Multiplanar CT image reconstructions including MIPs were obtained to evaluate the vascular anatomy.  Contrast:  119 ml Omni 300  Comparison:  November 10, 2005  Findings:  Biapical scarring is stable. Mild bibasilar pleural/ parenchymal scarring also appears unchanged. Otherwise, both lungs are clear.  There is no axillary, mediastinal, or hilar adenopathy. Mild cardiomegaly does not appear significantly changed.  There is a small hiatal hernia.  The bolus was not timed optimally for evaluation of pulmonary embolus but there are no gross filling defects within either pulmonary arterial system.  The thoracic aorta has a normal caliber and appearance.  Coronary artery and aortic atherosclerotic calcification is present.  There are no osseous lesions.  Review of the MIP images confirms the above findings.  IMPRESSION: There is stable pleural/ parenchymal scarring within both lungs. No acute abnormality.  Original Report Authenticated By: Brandon Melnick, M.D.   Ct Abdomen Pelvis W Contrast  06/21/2011  *RADIOLOGY REPORT*  Clinical Data: Abdominal bloating, vomiting, mid abdominal pain  CT ABDOMEN AND PELVIS WITH CONTRAST  Technique:  Multidetector CT imaging of the abdomen and pelvis was performed following the standard protocol during bolus administration of intravenous contrast.  Contrast:  119 ml Omni 300  Comparison: June 29, 2006  Findings: The stomach and small bowel are diffusely dilated extending to the terminal ileum.  The cecum and ascending colon are also fluid filled and dilated.  There is focal narrowing at the hepatic flexure but no gross associated soft tissue mass.  Dense opaque material is seen at the cecal level which may be related be related to prior appendectomy or small appendicoliths.  There is also a large stool burden at the rectum.  Left inguinal hernia is present which contains a loop of sigmoid colon.  This is nonobstructive.  There is  descending and sigmoid diverticulosis without radiographic evidence of diverticulitis. There is no adenopathy within the abdomen or pelvis there is several small scattered lymph nodes within the mesentery.  No free fluid.  There is a small hiatal hernia.  The liver, gallbladder, spleen, pancreas, adrenal glands, left kidney, urinary bladder have a normal appearance.  There is a small cortical cyst on the right kidney.  There are several diverticula at the base of the bladder. The old pelvic fractures are noticed with orthopedic fixation. Degenerative changes are present in the lower lumbar spine.  There are atherosclerotic calcifications within the aorta and takeoff vessels.  IMPRESSION: There is diffuse dilatation of the stomach, small bowel, and ascending colon with a focal narrowing and transition to normal caliber colon at the hepatic flexure level.  Even though there is no gross soft tissue mass, obstructing neoplasm cannot be excluded. Another consideration is an ischemic stricture, given the history of vascular disease in this patient.  Small hiatal hernia and nonobstructing left  inguinal hernia.  Urinary bladder diverticula.  Original Report Authenticated By: Brandon Melnick, M.D.   Dg Abd Acute W/chest  06/21/2011  *RADIOLOGY REPORT*  Clinical Data: Abdominal bloating and hemoptysis  ACUTE ABDOMEN SERIES (ABDOMEN 2 VIEW & CHEST 1 VIEW)  Comparison: CT abdomen 06/29/2006  Findings: Normal cardiac silhouette.  Chronic elevation of the left hemidiaphragm.  Lungs are clear.  There are several dilated loops of small bowel centrally within the abdomen measuring up to 4 cm.  There are several short air fluid levels within the small bowel.  No free air beneath hemidiaphragms. There is stool in the descending colon.  There is a large stool ball within the rectum measuring 8.5 cm.  The stomach is gas distended.  IMPRESSION:  1.  Dilated loops of small bowel which short air fluid levels suggest ileus versus early  mechanical obstruction. 2.  Large stool ball within the rectum.  Cannot exclude a distal obstruction.  Original Report Authenticated By: Genevive Bi, M.D.    MEDICATIONS: Scheduled Meds:    . antiseptic oral rinse  15 mL Mouth Rinse q12n4p  . chlorhexidine  15 mL Mouth/Throat BID  . enoxaparin (LOVENOX) injection  40 mg Subcutaneous Daily  . ertapenem  1 g Intravenous Once  . HYDROmorphone PCA 0.3 mg/mL   Intravenous Q4H  . insulin aspart  0-15 Units Subcutaneous TID WC  . metoprolol  2.5 mg Intravenous Q6H  . pantoprazole (PROTONIX) IV  40 mg Intravenous Q24H  . simvastatin  40 mg Oral QHS  . DISCONTD: ertapenem  1 g Intravenous To OR  . DISCONTD: ertapenem  1 g Intravenous Q24H   Continuous Infusions:    . dextrose 5 % and 0.45 % NaCl with KCl 20 mEq/L 1,000 mL (06/26/11 1047)  . DISCONTD: dextrose 5 % and 0.9% NaCl 100 mL/hr at 06/26/11 0222   PRN Meds:.acetaminophen, acetaminophen, albuterol, alum & mag hydroxide-simeth, diphenhydrAMINE, diphenhydrAMINE, hydrALAZINE, naloxone, ondansetron (ZOFRAN) IV, promethazine, promethazine, sodium chloride, DISCONTD: fentaNYL, DISCONTD: fentaNYL, DISCONTD: HYDROmorphone, DISCONTD: HYDROmorphone, DISCONTD: LORazepam, DISCONTD: midazolam, DISCONTD: promethazine  Antibiotics: Anti-infectives     Start     Dose/Rate Route Frequency Ordered Stop   06/26/11 1000   ertapenem (INVANZ) 1 g in sodium chloride 0.9 % 50 mL IVPB  Status:  Discontinued        1 g 100 mL/hr over 30 Minutes Intravenous Every 24 hours 06/25/11 1828 06/26/11 0855   06/26/11 1000   ertapenem (INVANZ) 1 g in sodium chloride 0.9 % 50 mL IVPB        1 g 100 mL/hr over 30 Minutes Intravenous  Once 06/26/11 0855 06/26/11 1116   06/25/11 1000   ertapenem (INVANZ) 1 g in sodium chloride 0.9 % 50 mL IVPB  Status:  Discontinued        1 g 100 mL/hr over 30 Minutes Intravenous To Surgery 06/25/11 0956 06/25/11 1828   06/24/11 0900   ertapenem (INVANZ) 1 g in sodium chloride  0.9 % 50 mL IVPB        1 g 100 mL/hr over 30 Minutes Intravenous  Once 06/24/11 1313 06/25/11 1005          Assessment/Plan: Patient Active Hospital Problem List: Colonic mass    Assessment: Likely malignancy. Biopsy-suspicion for adenocarcinoma high appear.Now status post partial colectomy    Plan: Postop care per surgery, no flatus yet. Continue to monitor till patient regains Bowel Function.  DIABETES MELLITUS, TYPE II    Assessment: CBGs stable  Plan: Continue with SSI. Resume metformin at discharge the  HYPERLIPIDEMIA    Assessment: Stable    Plan: Continue With Zocor.   HYPERTENSION    Assessment: better controlled today   Plan: continue with IV Lopressor till oral intake resumed  CORONARY ARTERY DISEASE  Assessment: Seems to have tolerated colectomy without any issues.    Plan: We'll continue to monitor this issue closely. Resume aspirin once okay with surgery.   GERD    Assessment: Stable    Plan: Continue with PPI  Disposition: Remain inpatient.  DVT Prophylaxis: Bilateral SCDs for now.  Code Status: Full code.  Maretta Bees,  MD. 06/26/2011, 12:02 PM

## 2011-06-27 ENCOUNTER — Encounter (HOSPITAL_COMMUNITY): Payer: Self-pay | Admitting: General Surgery

## 2011-06-27 LAB — CBC
MCV: 87.8 fL (ref 78.0–100.0)
Platelets: 139 10*3/uL — ABNORMAL LOW (ref 150–400)
RBC: 4.42 MIL/uL (ref 4.22–5.81)
RDW: 13.7 % (ref 11.5–15.5)
WBC: 8.9 10*3/uL (ref 4.0–10.5)

## 2011-06-27 LAB — GLUCOSE, CAPILLARY
Glucose-Capillary: 130 mg/dL — ABNORMAL HIGH (ref 70–99)
Glucose-Capillary: 146 mg/dL — ABNORMAL HIGH (ref 70–99)

## 2011-06-27 LAB — BASIC METABOLIC PANEL
CO2: 29 mEq/L (ref 19–32)
Calcium: 8.6 mg/dL (ref 8.4–10.5)
Creatinine, Ser: 0.9 mg/dL (ref 0.50–1.35)
GFR calc Af Amer: >90 mL/min (ref >90)
Sodium: 140 mEq/L (ref 135–145)

## 2011-06-27 NOTE — Progress Notes (Signed)
2 Days Post-Op  Subjective: Pt ok. Still belching quite a bit. No N/V but feeling somewhat bloated. No flatus yet. Trouble voiding since foley removed, c/o swelling of penis and scrotum. Pain control otherwise well controlled.  Objective: Vital signs in last 24 hours: Temp:  [97.8 F (36.6 C)-98.4 F (36.9 C)] 97.8 F (36.6 C) (12/07 0517) Pulse Rate:  [81-94] 84  (12/07 0517) Resp:  [16-20] 18  (12/07 0825) BP: (97-177)/(73-102) 97/73 mmHg (12/07 0517) SpO2:  [94 %-99 %] 98 % (12/07 0825) Last BM Date: 06/24/11  Intake/Output this shift:    Physical Exam: BP 97/73  Pulse 84  Temp(Src) 97.8 F (36.6 C) (Oral)  Resp 18  Ht 5\' 10"  (1.778 m)  Wt 222 lb (100.699 kg)  BMI 31.85 kg/m2  SpO2 98% Lungs: CTA without w/r/r Heart: Regular Abdomen: soft, distended, appropriately tender, quiet BS   Incisions all c/d/i without erythema or hematoma. GU: soft edema of scrotum and penis. Ext: No edema or tenderness   Labs: CBC  Basename 06/27/11 0543 06/26/11 0640  WBC 8.9 12.2*  HGB 12.4* 12.2*  HCT 38.8* 38.3*  PLT 139* 139*   BMET  Basename 06/27/11 0543 06/26/11 0640  NA 140 139  K 3.9 3.7  CL 105 106  CO2 29 28  GLUCOSE 140* 147*  BUN 8 5*  CREATININE 0.90 1.02  CALCIUM 8.6 8.3*   LFT No results found for this basename: PROT,ALBUMIN,AST,ALT,ALKPHOS,BILITOT,BILIDIR,IBILI,LIPASE in the last 72 hours PT/INR No results found for this basename: LABPROT:2,INR:2 in the last 72 hours ABG No results found for this basename: PHART:2,PCO2:2,PO2:2,HCO3:2 in the last 72 hours  Studies/Results: No results found.  Assessment: Principal Problem:  *Colonic mass Active Problems:  DIABETES MELLITUS, TYPE II  HYPERLIPIDEMIA  HYPERTENSION  CORONARY ARTERY DISEASE  GERD Procedure(s): PARTIAL COLECTOMY Post op ileus Urinary retention Plan: Keep NPO, may need NG if vomiting begins Replace Foley Encourage OOB/activity.  LOS: 6 days    Marianna Fuss 06/27/2011

## 2011-06-27 NOTE — Progress Notes (Signed)
PATIENT DETAILS Name: Michael Holder Age: 65 y.o. Sex: male Date of Birth: 06-29-1946 Admit Date: 06/21/2011 [name], MD, MD  Subjective: Belching. No flatus yet.  Objective: Vital signs in last 24 hours: Filed Vitals:   06/27/11 0825 06/27/11 1000 06/27/11 1254 06/27/11 1400  BP:  154/91  148/96  Pulse:  91  80  Temp:  98.3 F (36.8 C)  98.3 F (36.8 C)  TempSrc:  Oral  Oral  Resp: 18 18 18 18   Height:      Weight:      SpO2: 98% 98% 96% 99%    Weight change:   Body mass index is 31.85 kg/(m^2).  Intake/Output from previous day:  Intake/Output Summary (Last 24 hours) at 06/27/11 1629 Last data filed at 06/27/11 1500  Gross per 24 hour  Intake 2346.4 ml  Output    400 ml  Net 1946.4 ml    PHYSICAL EXAM: Gen Exam: Awake and alert with clear speech.   Neck: Supple, No JVD.   Chest: B/L Clear. No rhonchi or rales. CVS: S1 S2 Regular, no murmurs. Not tachycardic. Abdomen: soft, BS +, non tender, non distended. Dressing in place at the incision site. Currently dry Extremities: no edema, lower extremities warm to touch. Neurologic: Non Focal.   Skin: No Rash.   Wounds: N/A.    CONSULTS:  general surgery  LAB RESULTS: CBC  Lab 06/27/11 0543 06/26/11 0640 06/25/11 0802 06/21/11 2205 06/21/11 0827  WBC 8.9 12.2* 8.1 4.3 --  HGB 12.4* 12.2* 12.2* 12.8* 16.3  HCT 38.8* 38.3* 37.8* 38.1* 48.0  PLT 139* 139* 122* 147* --  MCV 87.8 87.2 86.5 85.6 --  MCH 28.1 27.8 27.9 28.8 --  MCHC 32.0 31.9 32.3 33.6 --  RDW 13.7 13.7 13.4 13.7 --  LYMPHSABS -- -- -- -- --  MONOABS -- -- -- -- --  EOSABS -- -- -- -- --  BASOSABS -- -- -- -- --  BANDABS -- -- -- -- --    Chemistries   Lab 06/27/11 0543 06/26/11 0640 06/25/11 0802 06/22/11 0500 06/21/11 2205  NA 140 139 138 134* 135  K 3.9 3.7 3.5 4.1 4.0  CL 105 106 104 99 100  CO2 29 28 26 28 26   GLUCOSE 140* 147* 125* 114* 129*  BUN 8 5* 4* 24* 24*  CREATININE 0.90 1.02 1.04 1.39* 1.23  CALCIUM 8.6  8.3* 8.0* 8.1* 8.0*  MG -- -- -- -- 1.9    GFR Estimated Creatinine Clearance: 96 ml/min (by C-G formula based on Cr of 0.9).  Coagulation profile No results found for this basename: INR:5,PROTIME:5 in the last 168 hours  Cardiac Enzymes  Lab 06/22/11 1709 06/22/11 1028 06/21/11 2206  CKMB 3.7 4.0 5.6*  TROPONINI <0.30 <0.30 <0.30  MYOGLOBIN -- -- --    No results found for this basename: POCBNP:3 in the last 168 hours No results found for this basename: DDIMER:2 in the last 72 hours No results found for this basename: HGBA1C:2 in the last 72 hours No results found for this basename: CHOL:2,HDL:2,LDLCALC:2,TRIG:2,CHOLHDL:2,LDLDIRECT:2 in the last 72 hours No results found for this basename: TSH,T4TOTAL,FREET3,T3FREE,THYROIDAB in the last 72 hours No results found for this basename: VITAMINB12:2,FOLATE:2,FERRITIN:2,TIBC:2,IRON:2,RETICCTPCT:2 in the last 72 hours No results found for this basename: LIPASE:2,AMYLASE:2 in the last 72 hours  Urine Studies No results found for this basename: UACOL:2,UAPR:2,USPG:2,UPH:2,UTP:2,UGL:2,UKET:2,UBIL:2,UHGB:2,UNIT:2,UROB:2,ULEU:2,UEPI:2,UWBC:2,URBC:2,UBAC:2,CAST:2,CRYS:2,UCOM:2,BILUA:2 in the last 72 hours  MICROBIOLOGY: No results found for this or any previous visit (from the past 240 hour(s)).  RADIOLOGY STUDIES/RESULTS: Dg Abd 1 View  06/23/2011  *RADIOLOGY REPORT*  Clinical Data: Nausea vomiting.  Abdominal pain.  ABDOMEN - 1 VIEW  Comparison: 06/21/2011  Findings: Supine view of the abdomen shows interval decrease in gaseous small bowel distention.  There is more air now visible in the left colon.  IMPRESSION: Interval decreasing gaseous small bowel distention with slightly more gas visible in the left colon.  Original Report Authenticated By: ERIC A. MANSELL, M.D.   Ct Angio Chest W/cm &/or Wo Cm  06/21/2011  *RADIOLOGY REPORT*  Clinical Data:  Hemoptysis and hypoxia  CT ANGIOGRAPHY CHEST WITH CONTRAST  Technique:  Multidetector CT  imaging of the chest was performed using the standard protocol during bolus administration of intravenous contrast.  Multiplanar CT image reconstructions including MIPs were obtained to evaluate the vascular anatomy.  Contrast:  119 ml Omni 300  Comparison:  November 10, 2005  Findings:  Biapical scarring is stable. Mild bibasilar pleural/ parenchymal scarring also appears unchanged. Otherwise, both lungs are clear.  There is no axillary, mediastinal, or hilar adenopathy. Mild cardiomegaly does not appear significantly changed.  There is a small hiatal hernia.  The bolus was not timed optimally for evaluation of pulmonary embolus but there are no gross filling defects within either pulmonary arterial system.  The thoracic aorta has a normal caliber and appearance.  Coronary artery and aortic atherosclerotic calcification is present.  There are no osseous lesions.  Review of the MIP images confirms the above findings.  IMPRESSION: There is stable pleural/ parenchymal scarring within both lungs. No acute abnormality.  Original Report Authenticated By: Brandon Melnick, M.D.   Ct Abdomen Pelvis W Contrast  06/21/2011  *RADIOLOGY REPORT*  Clinical Data: Abdominal bloating, vomiting, mid abdominal pain  CT ABDOMEN AND PELVIS WITH CONTRAST  Technique:  Multidetector CT imaging of the abdomen and pelvis was performed following the standard protocol during bolus administration of intravenous contrast.  Contrast:  119 ml Omni 300  Comparison: June 29, 2006  Findings: The stomach and small bowel are diffusely dilated extending to the terminal ileum.  The cecum and ascending colon are also fluid filled and dilated.  There is focal narrowing at the hepatic flexure but no gross associated soft tissue mass.  Dense opaque material is seen at the cecal level which may be related be related to prior appendectomy or small appendicoliths.  There is also a large stool burden at the rectum.  Left inguinal hernia is present which  contains a loop of sigmoid colon.  This is nonobstructive.  There is descending and sigmoid diverticulosis without radiographic evidence of diverticulitis. There is no adenopathy within the abdomen or pelvis there is several small scattered lymph nodes within the mesentery.  No free fluid.  There is a small hiatal hernia.  The liver, gallbladder, spleen, pancreas, adrenal glands, left kidney, urinary bladder have a normal appearance.  There is a small cortical cyst on the right kidney.  There are several diverticula at the base of the bladder. The old pelvic fractures are noticed with orthopedic fixation. Degenerative changes are present in the lower lumbar spine.  There are atherosclerotic calcifications within the aorta and takeoff vessels.  IMPRESSION: There is diffuse dilatation of the stomach, small bowel, and ascending colon with a focal narrowing and transition to normal caliber colon at the hepatic flexure level.  Even though there is no gross soft tissue mass, obstructing neoplasm cannot be excluded. Another consideration is an ischemic stricture, given the history of  vascular disease in this patient.  Small hiatal hernia and nonobstructing left inguinal hernia.  Urinary bladder diverticula.  Original Report Authenticated By: Brandon Melnick, M.D.   Dg Abd Acute W/chest  06/21/2011  *RADIOLOGY REPORT*  Clinical Data: Abdominal bloating and hemoptysis  ACUTE ABDOMEN SERIES (ABDOMEN 2 VIEW & CHEST 1 VIEW)  Comparison: CT abdomen 06/29/2006  Findings: Normal cardiac silhouette.  Chronic elevation of the left hemidiaphragm.  Lungs are clear.  There are several dilated loops of small bowel centrally within the abdomen measuring up to 4 cm.  There are several short air fluid levels within the small bowel.  No free air beneath hemidiaphragms. There is stool in the descending colon.  There is a large stool ball within the rectum measuring 8.5 cm.  The stomach is gas distended.  IMPRESSION:  1.  Dilated loops of  small bowel which short air fluid levels suggest ileus versus early mechanical obstruction. 2.  Large stool ball within the rectum.  Cannot exclude a distal obstruction.  Original Report Authenticated By: Genevive Bi, M.D.    MEDICATIONS: Scheduled Meds:    . antiseptic oral rinse  15 mL Mouth Rinse q12n4p  . chlorhexidine  15 mL Mouth/Throat BID  . enoxaparin (LOVENOX) injection  40 mg Subcutaneous Daily  . HYDROmorphone PCA 0.3 mg/mL   Intravenous Q4H  . insulin aspart  0-15 Units Subcutaneous TID WC  . metoprolol  2.5 mg Intravenous Q6H  . pantoprazole (PROTONIX) IV  40 mg Intravenous Q24H  . simvastatin  40 mg Oral QHS   Continuous Infusions:    . dextrose 5 % and 0.45 % NaCl with KCl 20 mEq/L 100 mL/hr at 06/27/11 1500   PRN Meds:.acetaminophen, acetaminophen, albuterol, alum & mag hydroxide-simeth, diphenhydrAMINE, diphenhydrAMINE, hydrALAZINE, naloxone, ondansetron (ZOFRAN) IV, promethazine, promethazine, sodium chloride  Antibiotics: Anti-infectives     Start     Dose/Rate Route Frequency Ordered Stop   06/26/11 1000   ertapenem (INVANZ) 1 g in sodium chloride 0.9 % 50 mL IVPB  Status:  Discontinued        1 g 100 mL/hr over 30 Minutes Intravenous Every 24 hours 06/25/11 1828 06/26/11 0855   06/26/11 1000   ertapenem (INVANZ) 1 g in sodium chloride 0.9 % 50 mL IVPB        1 g 100 mL/hr over 30 Minutes Intravenous  Once 06/26/11 0855 06/26/11 1116   06/25/11 1000   ertapenem (INVANZ) 1 g in sodium chloride 0.9 % 50 mL IVPB  Status:  Discontinued        1 g 100 mL/hr over 30 Minutes Intravenous To Surgery 06/25/11 0956 06/25/11 1828   06/24/11 0900   ertapenem (INVANZ) 1 g in sodium chloride 0.9 % 50 mL IVPB        1 g 100 mL/hr over 30 Minutes Intravenous  Once 06/24/11 1313 06/25/11 1005          Assessment/Plan: Patient Active Hospital Problem List: Colonic mass    Assessment: Likely malignancy. Biopsy-suspicion for adenocarcinoma high appear.Now status  post partial colectomy    Plan: Postop care per surgery, no flatus yet. Continue to monitor till patient regains Bowel Function. Encourage ambulation. Surgical pathology still pending.  DIABETES MELLITUS, TYPE II    Assessment: CBGs stable    Plan: Continue with SSI. Resume metformin at discharge.  HYPERLIPIDEMIA    Assessment: Stable    Plan: Continue With Zocor.   HYPERTENSION    Assessment:  controlled    Plan:  continue with IV Lopressor till oral intake resumed  CORONARY ARTERY DISEASE  Assessment: Seems to have tolerated colectomy without any issues.    Plan: We'll continue to monitor this issue closely. Resume aspirin once okay with surgery.   GERD    Assessment: Stable    Plan: Continue with PPI  Disposition: Remain inpatient.  DVT Prophylaxis: Bilateral SCDs for now.  Code Status: Full code.  Maretta Bees,  MD. 06/27/2011, 4:29 PM

## 2011-06-27 NOTE — Progress Notes (Signed)
Patient examined and I agree with the assessment and plan  Margi Edmundson E  
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
## 2011-06-27 NOTE — Progress Notes (Signed)
Pt. Foley catheter removed at about 1130 on 12/6. Pt unable to void and bladder scanned for 185 ml. Pt. Still unable to void by about 0100 and I&O cathed for 400 ml. Pt still unable to void at this time. Will continue to monitor.  Pt. Also experiencing a lot of burping somewhat relieved by Maalox and sitting up in chair.

## 2011-06-28 LAB — GLUCOSE, CAPILLARY
Glucose-Capillary: 120 mg/dL — ABNORMAL HIGH (ref 70–99)
Glucose-Capillary: 121 mg/dL — ABNORMAL HIGH (ref 70–99)

## 2011-06-28 LAB — BASIC METABOLIC PANEL
BUN: 8 mg/dL (ref 6–23)
Calcium: 8.7 mg/dL (ref 8.4–10.5)
GFR calc non Af Amer: 84 mL/min — ABNORMAL LOW (ref >90)
Glucose, Bld: 125 mg/dL — ABNORMAL HIGH (ref 70–99)
Sodium: 137 mEq/L (ref 135–145)

## 2011-06-28 MED ORDER — HYDROMORPHONE 0.3 MG/ML IV SOLN
INTRAVENOUS | Status: AC
  Filled 2011-06-28: qty 25

## 2011-06-28 NOTE — Progress Notes (Signed)
PATIENT DETAILS Name: Michael Holder Age: 65 y.o. Sex: male Date of Birth: 10-Sep-1945 Admit Date: 06/21/2011 ZOX:[id number] Copland, MD, MD  Subjective: Had one small BM earlier today.  Objective: Vital signs in last 24 hours: Filed Vitals:   06/28/11 0800 06/28/11 1136 06/28/11 1137 06/28/11 1200  BP:      Pulse:      Temp:      TempSrc:      Resp: 18 18 18 18   Height:      Weight:      SpO2: 100% 96% 96% 98%    Weight change:   Body mass index is 31.85 kg/(m^2).  Intake/Output from previous day:  Intake/Output Summary (Last 24 hours) at 06/28/11 1427 Last data filed at 06/28/11 0554  Gross per 24 hour  Intake 2441.33 ml  Output    725 ml  Net 1716.33 ml    PHYSICAL EXAM: Gen Exam: Awake and alert with clear speech.   Neck: Supple, No JVD.   Chest: B/L Clear.  A few by basilar rales. CVS: S1 S2 Regular, no murmurs.  Abdomen: soft, BS +, non tender, slightly more distended. Dressing in place at the incision site. Currently dry Extremities: Trace edema, lower extremities warm to touch. Neurologic: Non Focal.   Skin: No Rash.   Wounds: N/A.    CONSULTS:  general surgery  LAB RESULTS: CBC  Lab 06/27/11 0543 06/26/11 0640 06/25/11 0802 06/21/11 2205  WBC 8.9 12.2* 8.1 4.3  HGB 12.4* 12.2* 12.2* 12.8*  HCT 38.8* 38.3* 37.8* 38.1*  PLT 139* 139* 122* 147*  MCV 87.8 87.2 86.5 85.6  MCH 28.1 27.8 27.9 28.8  MCHC 32.0 31.9 32.3 33.6  RDW 13.7 13.7 13.4 13.7  LYMPHSABS -- -- -- --  MONOABS -- -- -- --  EOSABS -- -- -- --  BASOSABS -- -- -- --  BANDABS -- -- -- --    Chemistries   Lab 06/28/11 0932 06/27/11 0543 06/26/11 0640 06/25/11 0802 06/22/11 0500 06/21/11 2205  NA 137 140 139 138 134* --  K 4.1 3.9 3.7 3.5 4.1 --  CL 101 105 106 104 99 --  CO2 30 29 28 26 28  --  GLUCOSE 125* 140* 147* 125* 114* --  BUN 8 8 5* 4* 24* --  CREATININE 0.97 0.90 1.02 1.04 1.39* --  CALCIUM 8.7 8.6 8.3* 8.0* 8.1* --  MG -- -- -- -- -- 1.9    GFR Estimated  Creatinine Clearance: 89.1 ml/min (by C-G formula based on Cr of 0.97).  Coagulation profile No results found for this basename: INR:5,PROTIME:5 in the last 168 hours  Cardiac Enzymes  Lab 06/22/11 1709 06/22/11 1028 06/21/11 2206  CKMB 3.7 4.0 5.6*  TROPONINI <0.30 <0.30 <0.30  MYOGLOBIN -- -- --    No results found for this basename: POCBNP:3 in the last 168 hours No results found for this basename: DDIMER:2 in the last 72 hours No results found for this basename: HGBA1C:2 in the last 72 hours No results found for this basename: CHOL:2,HDL:2,LDLCALC:2,TRIG:2,CHOLHDL:2,LDLDIRECT:2 in the last 72 hours No results found for this basename: TSH,T4TOTAL,FREET3,T3FREE,THYROIDAB in the last 72 hours No results found for this basename: VITAMINB12:2,FOLATE:2,FERRITIN:2,TIBC:2,IRON:2,RETICCTPCT:2 in the last 72 hours No results found for this basename: LIPASE:2,AMYLASE:2 in the last 72 hours  Urine Studies No results found for this basename: UACOL:2,UAPR:2,USPG:2,UPH:2,UTP:2,UGL:2,UKET:2,UBIL:2,UHGB:2,UNIT:2,UROB:2,ULEU:2,UEPI:2,UWBC:2,URBC:2,UBAC:2,CAST:2,CRYS:2,UCOM:2,BILUA:2 in the last 72 hours  MICROBIOLOGY: No results found for this or any previous visit (from the past 240 hour(s)).  RADIOLOGY STUDIES/RESULTS: Dg Abd 1 View  06/23/2011  *RADIOLOGY REPORT*  Clinical Data: Nausea vomiting.  Abdominal pain.  ABDOMEN - 1 VIEW  Comparison: 06/21/2011  Findings: Supine view of the abdomen shows interval decrease in gaseous small bowel distention.  There is more air now visible in the left colon.  IMPRESSION: Interval decreasing gaseous small bowel distention with slightly more gas visible in the left colon.  Original Report Authenticated By: ERIC A. MANSELL, M.D.   Ct Angio Chest W/cm &/or Wo Cm  06/21/2011  *RADIOLOGY REPORT*  Clinical Data:  Hemoptysis and hypoxia  CT ANGIOGRAPHY CHEST WITH CONTRAST  Technique:  Multidetector CT imaging of the chest was performed using the standard protocol  during bolus administration of intravenous contrast.  Multiplanar CT image reconstructions including MIPs were obtained to evaluate the vascular anatomy.  Contrast:  119 ml Omni 300  Comparison:  November 10, 2005  Findings:  Biapical scarring is stable. Mild bibasilar pleural/ parenchymal scarring also appears unchanged. Otherwise, both lungs are clear.  There is no axillary, mediastinal, or hilar adenopathy. Mild cardiomegaly does not appear significantly changed.  There is a small hiatal hernia.  The bolus was not timed optimally for evaluation of pulmonary embolus but there are no gross filling defects within either pulmonary arterial system.  The thoracic aorta has a normal caliber and appearance.  Coronary artery and aortic atherosclerotic calcification is present.  There are no osseous lesions.  Review of the MIP images confirms the above findings.  IMPRESSION: There is stable pleural/ parenchymal scarring within both lungs. No acute abnormality.  Original Report Authenticated By: Brandon Melnick, M.D.   Ct Abdomen Pelvis W Contrast  06/21/2011  *RADIOLOGY REPORT*  Clinical Data: Abdominal bloating, vomiting, mid abdominal pain  CT ABDOMEN AND PELVIS WITH CONTRAST  Technique:  Multidetector CT imaging of the abdomen and pelvis was performed following the standard protocol during bolus administration of intravenous contrast.  Contrast:  119 ml Omni 300  Comparison: June 29, 2006  Findings: The stomach and small bowel are diffusely dilated extending to the terminal ileum.  The cecum and ascending colon are also fluid filled and dilated.  There is focal narrowing at the hepatic flexure but no gross associated soft tissue mass.  Dense opaque material is seen at the cecal level which may be related be related to prior appendectomy or small appendicoliths.  There is also a large stool burden at the rectum.  Left inguinal hernia is present which contains a loop of sigmoid colon.  This is nonobstructive.  There is  descending and sigmoid diverticulosis without radiographic evidence of diverticulitis. There is no adenopathy within the abdomen or pelvis there is several small scattered lymph nodes within the mesentery.  No free fluid.  There is a small hiatal hernia.  The liver, gallbladder, spleen, pancreas, adrenal glands, left kidney, urinary bladder have a normal appearance.  There is a small cortical cyst on the right kidney.  There are several diverticula at the base of the bladder. The old pelvic fractures are noticed with orthopedic fixation. Degenerative changes are present in the lower lumbar spine.  There are atherosclerotic calcifications within the aorta and takeoff vessels.  IMPRESSION: There is diffuse dilatation of the stomach, small bowel, and ascending colon with a focal narrowing and transition to normal caliber colon at the hepatic flexure level.  Even though there is no gross soft tissue mass, obstructing neoplasm cannot be excluded. Another consideration is an ischemic stricture, given the history of vascular disease in this patient.  Small  hiatal hernia and nonobstructing left inguinal hernia.  Urinary bladder diverticula.  Original Report Authenticated By: Brandon Melnick, M.D.   Dg Abd Acute W/chest  06/21/2011  *RADIOLOGY REPORT*  Clinical Data: Abdominal bloating and hemoptysis  ACUTE ABDOMEN SERIES (ABDOMEN 2 VIEW & CHEST 1 VIEW)  Comparison: CT abdomen 06/29/2006  Findings: Normal cardiac silhouette.  Chronic elevation of the left hemidiaphragm.  Lungs are clear.  There are several dilated loops of small bowel centrally within the abdomen measuring up to 4 cm.  There are several short air fluid levels within the small bowel.  No free air beneath hemidiaphragms. There is stool in the descending colon.  There is a large stool ball within the rectum measuring 8.5 cm.  The stomach is gas distended.  IMPRESSION:  1.  Dilated loops of small bowel which short air fluid levels suggest ileus versus early  mechanical obstruction. 2.  Large stool ball within the rectum.  Cannot exclude a distal obstruction.  Original Report Authenticated By: Genevive Bi, M.D.    MEDICATIONS: Scheduled Meds:    . antiseptic oral rinse  15 mL Mouth Rinse q12n4p  . chlorhexidine  15 mL Mouth/Throat BID  . enoxaparin (LOVENOX) injection  40 mg Subcutaneous Daily  . HYDROmorphone PCA 0.3 mg/mL   Intravenous Q4H  . HYDROmorphone PCA 0.3 mg/mL      . insulin aspart  0-15 Units Subcutaneous TID WC  . metoprolol  2.5 mg Intravenous Q6H  . pantoprazole (PROTONIX) IV  40 mg Intravenous Q24H  . simvastatin  40 mg Oral QHS   Continuous Infusions:    . dextrose 5 % and 0.45 % NaCl with KCl 20 mEq/L 100 mL/hr at 06/28/11 0409   PRN Meds:.acetaminophen, acetaminophen, albuterol, alum & mag hydroxide-simeth, diphenhydrAMINE, diphenhydrAMINE, hydrALAZINE, naloxone, ondansetron (ZOFRAN) IV, promethazine, promethazine, sodium chloride  Antibiotics: Anti-infectives     Start     Dose/Rate Route Frequency Ordered Stop   06/26/11 1000   ertapenem (INVANZ) 1 g in sodium chloride 0.9 % 50 mL IVPB  Status:  Discontinued        1 g 100 mL/hr over 30 Minutes Intravenous Every 24 hours 06/25/11 1828 06/26/11 0855   06/26/11 1000   ertapenem (INVANZ) 1 g in sodium chloride 0.9 % 50 mL IVPB        1 g 100 mL/hr over 30 Minutes Intravenous  Once 06/26/11 0855 06/26/11 1116   06/25/11 1000   ertapenem (INVANZ) 1 g in sodium chloride 0.9 % 50 mL IVPB  Status:  Discontinued        1 g 100 mL/hr over 30 Minutes Intravenous To Surgery 06/25/11 0956 06/25/11 1828   06/24/11 0900   ertapenem (INVANZ) 1 g in sodium chloride 0.9 % 50 mL IVPB        1 g 100 mL/hr over 30 Minutes Intravenous  Once 06/24/11 1313 06/25/11 1005          Assessment/Plan: Patient Active Hospital Problem List: Colonic mass    Assessment: Likely malignancy. Biopsy still pending-suspicion for adenocarcinoma high.Now status post partial colectomy     Plan: Postop care per surgery, abdomen distended however now passing flatus and some stools. We'll defer starting oral intake to surgery team.   DIABETES MELLITUS, TYPE II    Assessment: CBGs stable    Plan: Continue with SSI. Resume metformin at discharge.  HYPERLIPIDEMIA    Assessment: Stable    Plan: Continue With Zocor.   HYPERTENSION    Assessment: better  controlled today   Plan: continue with IV Lopressor till oral intake resumed  CORONARY ARTERY DISEASE  Assessment: Seems to have tolerated colectomy without any issues.    Plan: We'll continue to monitor this issue closely. Resume aspirin once okay with surgery.   GERD    Assessment: Stable    Plan: Continue with PPI  Disposition: Remain inpatient.  DVT Prophylaxis: Bilateral SCDs for now.  Code Status: Full code.  Maretta Bees,  MD. 06/28/2011, 2:27 PM

## 2011-06-28 NOTE — Progress Notes (Signed)
3 Days Post-Op  Subjective: Patient feels a little better. He states that he has passed some flatus and had a small bowel movement. He still feels a little bit bloated.  Objective: Vital signs in last 24 hours: Temp:  [97.9 F (36.6 C)-98.6 F (37 C)] 97.9 F (36.6 C) (12/08 0525) Pulse Rate:  [78-91] 83  (12/08 0525) Resp:  [16-18] 18  (12/08 0800) BP: (93-154)/(63-96) 135/75 mmHg (12/08 0525) SpO2:  [95 %-100 %] 100 % (12/08 0800) Last BM Date: 06/24/11  Intake/Output from previous day: 12/07 0701 - 12/08 0700 In: 2441.3 [I.V.:2441.3] Out: 725 [Urine:725] Intake/Output this shift:    GI: abdomen is soft but distended. He does have some bowel sounds. Nontender. Incision is okay.  Lab Results:   [facility] 06/27/11 0543 06/26/11 0640  WBC 8.9 12.2*  HGB 12.4* 12.2*  HCT 38.8* 38.3*  PLT 139* 139*   BMET  Basename 06/27/11 0543 06/26/11 0640  NA 140 139  K 3.9 3.7  CL 105 106  CO2 29 28  GLUCOSE 140* 147*  BUN 8 5*  CREATININE 0.90 1.02  CALCIUM 8.6 8.3*   PT/INR No results found for this basename: LABPROT:2,INR:2 in the last 72 hours ABG No results found for this basename: PHART:2,PCO2:2,PO2:2,HCO3:2 in the last 72 hours  Studies/Results: No results found.  Anti-infectives: Anti-infectives     Start     Dose/Rate Route Frequency Ordered Stop   06/26/11 1000   ertapenem (INVANZ) 1 g in sodium chloride 0.9 % 50 mL IVPB  Status:  Discontinued        1 g 100 mL/hr over 30 Minutes Intravenous Every 24 hours 06/25/11 1828 06/26/11 0855   06/26/11 1000   ertapenem (INVANZ) 1 g in sodium chloride 0.9 % 50 mL IVPB        1 g 100 mL/hr over 30 Minutes Intravenous  Once 06/26/11 0855 06/26/11 1116   06/25/11 1000   ertapenem (INVANZ) 1 g in sodium chloride 0.9 % 50 mL IVPB  Status:  Discontinued        1 g 100 mL/hr over 30 Minutes Intravenous To Surgery 06/25/11 0956 06/25/11 1828   06/24/11 0900   ertapenem (INVANZ) 1 g in sodium chloride 0.9 % 50 mL IVPB        1 g 100 mL/hr over 30 Minutes Intravenous  Once 06/24/11 1313 06/25/11 1005          Assessment/Plan: s/p Procedure(s): PARTIAL COLECTOMY we will continue to hold on advancing his diet until his abdomen is less distended.  He will continue to get out of bed and move around.  LOS: 7 days    TOTH III,Michael Holder S 06/28/2011

## 2011-06-28 NOTE — Progress Notes (Signed)
Called [facility] MD regarding low urine output and no fluid found with bladder scan. No orders at this time. MD will evaluate on rounds this AM.

## 2011-06-28 NOTE — Progress Notes (Signed)
Pt. Had flatus and had a very small loose BM this AM.

## 2011-06-28 NOTE — Progress Notes (Signed)
Pt ambulated in hallway to the nurses station and back. During ambulation pt started to become slightly cyanotic around lips and nose at about the nurses station. I checked O2 sats which were in the high 70's and low 80's. Placed pt on 1L of O2 which brought O2 sats up to low 90's and ambulated back to room. Pt did not report feeling light headed or noticing a change in O2 saturation. I gave pt an incentive spirometer to work with and he was only able to get it up to 500 ml. I encouraged him to use it 10 times per hour while awake. Pt is currently on 1L of O2 while in bed/chair.

## 2011-06-29 LAB — GLUCOSE, CAPILLARY

## 2011-06-29 LAB — BASIC METABOLIC PANEL
Calcium: 8.1 mg/dL — ABNORMAL LOW (ref 8.4–10.5)
Creatinine, Ser: 1 mg/dL (ref 0.50–1.35)
GFR calc Af Amer: 89 mL/min — ABNORMAL LOW (ref >90)

## 2011-06-29 MED ORDER — METOCLOPRAMIDE HCL 5 MG/ML IJ SOLN
5.0000 mg | Freq: Three times a day (TID) | INTRAMUSCULAR | Status: AC
  Administered 2011-06-29 – 2011-07-01 (×7): 5 mg via INTRAVENOUS
  Filled 2011-06-29 (×9): qty 1

## 2011-06-29 NOTE — Progress Notes (Signed)
4 Days Post-Op  Subjective: Feels better. Pos flatus  Objective: Vital signs in last 24 hours: Temp:  [97.8 F (36.6 C)-98.4 F (36.9 C)] 97.8 F (36.6 C) (12/09 0544) Pulse Rate:  [74-79] 79  (12/09 0544) Resp:  [18-20] 18  (12/09 0759) BP: (116-159)/(67-92) 159/92 mmHg (12/09 0544) SpO2:  [96 %-100 %] 98 % (12/09 0759) FiO2 (%):  [96 %] 96 % (12/08 1522) Last BM Date: 06/24/11  Intake/Output from previous day: 12/08 0701 - 12/09 0700 In: 1200 [I.V.:1200] Out: 1150 [Urine:1150] Intake/Output this shift:    GI: soft. less distended. pos bowel sounds and flatus  Lab Results:   [facility] 06/27/11 0543  WBC 8.9  HGB 12.4*  HCT 38.8*  PLT 139*   BMET  Basename 06/29/11 0617 06/28/11 0932  NA 138 137  K 4.0 4.1  CL 104 101  CO2 30 30  GLUCOSE 130* 125*  BUN 7 8  CREATININE 1.00 0.97  CALCIUM 8.1* 8.7   PT/INR No results found for this basename: LABPROT:2,INR:2 in the last 72 hours ABG No results found for this basename: PHART:2,PCO2:2,PO2:2,HCO3:2 in the last 72 hours  Studies/Results: No results found.  Anti-infectives: Anti-infectives     Start     Dose/Rate Route Frequency Ordered Stop   06/26/11 1000   ertapenem (INVANZ) 1 g in sodium chloride 0.9 % 50 mL IVPB  Status:  Discontinued        1 g 100 mL/hr over 30 Minutes Intravenous Every 24 hours 06/25/11 1828 06/26/11 0855   06/26/11 1000   ertapenem (INVANZ) 1 g in sodium chloride 0.9 % 50 mL IVPB        1 g 100 mL/hr over 30 Minutes Intravenous  Once 06/26/11 0855 06/26/11 1116   06/25/11 1000   ertapenem (INVANZ) 1 g in sodium chloride 0.9 % 50 mL IVPB  Status:  Discontinued        1 g 100 mL/hr over 30 Minutes Intravenous To Surgery 06/25/11 0956 06/25/11 1828   06/24/11 0900   ertapenem (INVANZ) 1 g in sodium chloride 0.9 % 50 mL IVPB        1 g 100 mL/hr over 30 Minutes Intravenous  Once 06/24/11 1313 06/25/11 1005          Assessment/Plan: s/p Procedure(s): PARTIAL  COLECTOMY Advance diet start clears. add reglan. ambulate  LOS: 8 days    TOTH III,Michael Holder S 06/29/2011

## 2011-06-29 NOTE — Progress Notes (Signed)
PATIENT DETAILS Name: Michael Holder Age: 65 y.o. Sex: male Date of Birth: 11-04-1945 Admit Date: 06/21/2011 ZOX:[id number] Copland, MD, MD  Subjective: Had a BM today passing gas as well.  Objective: Vital signs in last 24 hours: Filed Vitals:   06/29/11 0544 06/29/11 0759 06/29/11 1200 06/29/11 1356  BP: 159/92   142/73  Pulse: 79   75  Temp: 97.8 F (36.6 C)   97.2 F (36.2 C)  TempSrc: Oral   Oral  Resp: 20 18 18 18   Height:      Weight:      SpO2: 96% 98% 98% 98%    Weight change:   Body mass index is 31.85 kg/(m^2).  Intake/Output from previous day:  Intake/Output Summary (Last 24 hours) at 06/29/11 1405 Last data filed at 06/29/11 0800  Gross per 24 hour  Intake   1560 ml  Output    850 ml  Net    710 ml    PHYSICAL EXAM: Gen Exam: Awake and alert with clear speech.   Neck: Supple, No JVD.   Chest: B/L Clear.  A few by basilar rales. CVS: S1 S2 Regular, no murmurs.  Abdomen: soft, BS +, non tender, slightly more distended. Dressing in place at the incision site. Currently dry Extremities: Trace edema, lower extremities warm to touch. Neurologic: Non Focal.   Skin: No Rash.   Wounds: N/A.    CONSULTS:  general surgery  LAB RESULTS: CBC  Lab 06/27/11 0543 06/26/11 0640 06/25/11 0802  WBC 8.9 12.2* 8.1  HGB 12.4* 12.2* 12.2*  HCT 38.8* 38.3* 37.8*  PLT 139* 139* 122*  MCV 87.8 87.2 86.5  MCH 28.1 27.8 27.9  MCHC 32.0 31.9 32.3  RDW 13.7 13.7 13.4  LYMPHSABS -- -- --  MONOABS -- -- --  EOSABS -- -- --  BASOSABS -- -- --  BANDABS -- -- --    Chemistries   Lab 06/29/11 0617 06/28/11 0932 06/27/11 0543 06/26/11 0640 06/25/11 0802  NA 138 137 140 139 138  K 4.0 4.1 3.9 3.7 3.5  CL 104 101 105 106 104  CO2 30 30 29 28 26   GLUCOSE 130* 125* 140* 147* 125*  BUN 7 8 8  5* 4*  CREATININE 1.00 0.97 0.90 1.02 1.04  CALCIUM 8.1* 8.7 8.6 8.3* 8.0*  MG -- -- -- -- --    GFR Estimated Creatinine Clearance: 86.4 ml/min (by C-G formula based on  Cr of 1).  Coagulation profile No results found for this basename: INR:5,PROTIME:5 in the last 168 hours  Cardiac Enzymes  Lab 06/22/11 1709  CKMB 3.7  TROPONINI <0.30  MYOGLOBIN --    No results found for this basename: POCBNP:3 in the last 168 hours No results found for this basename: DDIMER:2 in the last 72 hours No results found for this basename: HGBA1C:2 in the last 72 hours No results found for this basename: CHOL:2,HDL:2,LDLCALC:2,TRIG:2,CHOLHDL:2,LDLDIRECT:2 in the last 72 hours No results found for this basename: TSH,T4TOTAL,FREET3,T3FREE,THYROIDAB in the last 72 hours No results found for this basename: VITAMINB12:2,FOLATE:2,FERRITIN:2,TIBC:2,IRON:2,RETICCTPCT:2 in the last 72 hours No results found for this basename: LIPASE:2,AMYLASE:2 in the last 72 hours  Urine Studies No results found for this basename: UACOL:2,UAPR:2,USPG:2,UPH:2,UTP:2,UGL:2,UKET:2,UBIL:2,UHGB:2,UNIT:2,UROB:2,ULEU:2,UEPI:2,UWBC:2,URBC:2,UBAC:2,CAST:2,CRYS:2,UCOM:2,BILUA:2 in the last 72 hours  MICROBIOLOGY: No results found for this or any previous visit (from the past 240 hour(s)).  RADIOLOGY STUDIES/RESULTS: Dg Abd 1 View  06/23/2011  *RADIOLOGY REPORT*  Clinical Data: Nausea vomiting.  Abdominal pain.  ABDOMEN - 1 VIEW  Comparison: 06/21/2011  Findings:  Supine view of the abdomen shows interval decrease in gaseous small bowel distention.  There is more air now visible in the left colon.  IMPRESSION: Interval decreasing gaseous small bowel distention with slightly more gas visible in the left colon.  Original Report Authenticated By: ERIC A. MANSELL, M.D.   Ct Angio Chest W/cm &/or Wo Cm  06/21/2011  *RADIOLOGY REPORT*  Clinical Data:  Hemoptysis and hypoxia  CT ANGIOGRAPHY CHEST WITH CONTRAST  Technique:  Multidetector CT imaging of the chest was performed using the standard protocol during bolus administration of intravenous contrast.  Multiplanar CT image reconstructions including MIPs were  obtained to evaluate the vascular anatomy.  Contrast:  119 ml Omni 300  Comparison:  November 10, 2005  Findings:  Biapical scarring is stable. Mild bibasilar pleural/ parenchymal scarring also appears unchanged. Otherwise, both lungs are clear.  There is no axillary, mediastinal, or hilar adenopathy. Mild cardiomegaly does not appear significantly changed.  There is a small hiatal hernia.  The bolus was not timed optimally for evaluation of pulmonary embolus but there are no gross filling defects within either pulmonary arterial system.  The thoracic aorta has a normal caliber and appearance.  Coronary artery and aortic atherosclerotic calcification is present.  There are no osseous lesions.  Review of the MIP images confirms the above findings.  IMPRESSION: There is stable pleural/ parenchymal scarring within both lungs. No acute abnormality.  Original Report Authenticated By: Brandon Melnick, M.D.   Ct Abdomen Pelvis W Contrast  06/21/2011  *RADIOLOGY REPORT*  Clinical Data: Abdominal bloating, vomiting, mid abdominal pain  CT ABDOMEN AND PELVIS WITH CONTRAST  Technique:  Multidetector CT imaging of the abdomen and pelvis was performed following the standard protocol during bolus administration of intravenous contrast.  Contrast:  119 ml Omni 300  Comparison: June 29, 2006  Findings: The stomach and small bowel are diffusely dilated extending to the terminal ileum.  The cecum and ascending colon are also fluid filled and dilated.  There is focal narrowing at the hepatic flexure but no gross associated soft tissue mass.  Dense opaque material is seen at the cecal level which may be related be related to prior appendectomy or small appendicoliths.  There is also a large stool burden at the rectum.  Left inguinal hernia is present which contains a loop of sigmoid colon.  This is nonobstructive.  There is descending and sigmoid diverticulosis without radiographic evidence of diverticulitis. There is no adenopathy  within the abdomen or pelvis there is several small scattered lymph nodes within the mesentery.  No free fluid.  There is a small hiatal hernia.  The liver, gallbladder, spleen, pancreas, adrenal glands, left kidney, urinary bladder have a normal appearance.  There is a small cortical cyst on the right kidney.  There are several diverticula at the base of the bladder. The old pelvic fractures are noticed with orthopedic fixation. Degenerative changes are present in the lower lumbar spine.  There are atherosclerotic calcifications within the aorta and takeoff vessels.  IMPRESSION: There is diffuse dilatation of the stomach, small bowel, and ascending colon with a focal narrowing and transition to normal caliber colon at the hepatic flexure level.  Even though there is no gross soft tissue mass, obstructing neoplasm cannot be excluded. Another consideration is an ischemic stricture, given the history of vascular disease in this patient.  Small hiatal hernia and nonobstructing left inguinal hernia.  Urinary bladder diverticula.  Original Report Authenticated By: Brandon Melnick, M.D.  Dg Abd Acute W/chest  06/21/2011  *RADIOLOGY REPORT*  Clinical Data: Abdominal bloating and hemoptysis  ACUTE ABDOMEN SERIES (ABDOMEN 2 VIEW & CHEST 1 VIEW)  Comparison: CT abdomen 06/29/2006  Findings: Normal cardiac silhouette.  Chronic elevation of the left hemidiaphragm.  Lungs are clear.  There are several dilated loops of small bowel centrally within the abdomen measuring up to 4 cm.  There are several short air fluid levels within the small bowel.  No free air beneath hemidiaphragms. There is stool in the descending colon.  There is a large stool ball within the rectum measuring 8.5 cm.  The stomach is gas distended.  IMPRESSION:  1.  Dilated loops of small bowel which short air fluid levels suggest ileus versus early mechanical obstruction. 2.  Large stool ball within the rectum.  Cannot exclude a distal obstruction.  Original  Report Authenticated By: Genevive Bi, M.D.    MEDICATIONS: Scheduled Meds:    . antiseptic oral rinse  15 mL Mouth Rinse q12n4p  . chlorhexidine  15 mL Mouth/Throat BID  . enoxaparin (LOVENOX) injection  40 mg Subcutaneous Daily  . HYDROmorphone PCA 0.3 mg/mL   Intravenous Q4H  . HYDROmorphone PCA 0.3 mg/mL      . insulin aspart  0-15 Units Subcutaneous TID WC  . metoCLOPramide (REGLAN) injection  5 mg Intravenous Q8H  . metoprolol  2.5 mg Intravenous Q6H  . pantoprazole (PROTONIX) IV  40 mg Intravenous Q24H  . simvastatin  40 mg Oral QHS   Continuous Infusions:    . dextrose 5 % and 0.45 % NaCl with KCl 20 mEq/L 75 mL/hr at 06/29/11 0417   PRN Meds:.acetaminophen, acetaminophen, albuterol, alum & mag hydroxide-simeth, diphenhydrAMINE, diphenhydrAMINE, hydrALAZINE, naloxone, ondansetron (ZOFRAN) IV, promethazine, promethazine, sodium chloride  Antibiotics: Anti-infectives     Start     Dose/Rate Route Frequency Ordered Stop   06/26/11 1000   ertapenem (INVANZ) 1 g in sodium chloride 0.9 % 50 mL IVPB  Status:  Discontinued        1 g 100 mL/hr over 30 Minutes Intravenous Every 24 hours 06/25/11 1828 06/26/11 0855   06/26/11 1000   ertapenem (INVANZ) 1 g in sodium chloride 0.9 % 50 mL IVPB        1 g 100 mL/hr over 30 Minutes Intravenous  Once 06/26/11 0855 06/26/11 1116   06/25/11 1000   ertapenem (INVANZ) 1 g in sodium chloride 0.9 % 50 mL IVPB  Status:  Discontinued        1 g 100 mL/hr over 30 Minutes Intravenous To Surgery 06/25/11 0956 06/25/11 1828   06/24/11 0900   ertapenem (INVANZ) 1 g in sodium chloride 0.9 % 50 mL IVPB        1 g 100 mL/hr over 30 Minutes Intravenous  Once 06/24/11 1313 06/25/11 1005          Assessment/Plan: Patient Active Hospital Problem List: Colonic mass    Assessment: Likely malignancy. Biopsy still pending-suspicion for adenocarcinoma high.Now status post partial colectomy. 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    Plan: Postop  care per surgery, abdomen distended however now passing flatus and some stools. Now on clear liquids. CT scan of the abdomen and chest does not show any metastases. Further workup if any, can be done as an outpatient with oncology.  DIABETES MELLITUS, TYPE II    Assessment: CBGs stable    Plan: Continue with SSI. Resume metformin at discharge.  HYPERLIPIDEMIA    Assessment: Stable    Plan:  Continue With Zocor.   HYPERTENSION    Assessment: better controlled today   Plan: continue with IV Lopressor till oral intake resumed, if continues to tolerate clear liquids we will transition to oral antihypertensives tomorrow.  CORONARY ARTERY DISEASE  Assessment: Seems to have tolerated colectomy without any issues.    Plan: We'll continue to monitor this issue closely. Resume aspirin once okay with surgery.   GERD    Assessment: Stable    Plan: Continue with PPI  Disposition: Remain inpatient.  DVT Prophylaxis: Bilateral SCDs for now.  Code Status: Full code.  Maretta Bees,  MD. 06/29/2011, 2:05 PM

## 2011-06-30 LAB — GLUCOSE, CAPILLARY: Glucose-Capillary: 117 mg/dL — ABNORMAL HIGH (ref 70–99)

## 2011-06-30 MED ORDER — LISINOPRIL 40 MG PO TABS
40.0000 mg | ORAL_TABLET | Freq: Every day | ORAL | Status: DC
  Administered 2011-06-30 – 2011-07-02 (×3): 40 mg via ORAL
  Filled 2011-06-30 (×3): qty 1

## 2011-06-30 MED ORDER — PANTOPRAZOLE SODIUM 40 MG PO TBEC
40.0000 mg | DELAYED_RELEASE_TABLET | Freq: Every day | ORAL | Status: DC
  Administered 2011-06-30 – 2011-07-02 (×3): 40 mg via ORAL
  Filled 2011-06-30 (×3): qty 1

## 2011-06-30 MED ORDER — HYDROCODONE-ACETAMINOPHEN 5-325 MG PO TABS: 1.0000 | ORAL_TABLET | ORAL | Status: DC | PRN

## 2011-06-30 NOTE — Progress Notes (Addendum)
Agree with above.  Will discuss path with patient when available.  Wilmon Arms. Corliss Skains, MD, [facility]  06/30/2011 9:56 AM

## 2011-06-30 NOTE — Progress Notes (Signed)
   CARE MANAGEMENT NOTE 06/30/2011  Patient:  Michael Holder, Michael Holder   Account Number:  [id number]  Date Initiated:  06/25/2011  Documentation initiated by:  Donn Pierini  Subjective/Objective Assessment:   Pt admitted with abd pain, awaiting colectomy- scheduled for 06/25/11     Action/Plan:   PTA pt lived at home with spouse, was independent with ADLs   Anticipated DC Date:  07/02/2011   Anticipated DC Plan:  HOME/SELF CARE      DC Planning Services  CM consult      Choice offered to / List presented to:             Status of service:  In process, will continue to follow Medicare Important Message given?   (If response is "NO", the following Medicare IM given date fields will be blank) Date Medicare IM given:   Date Additional Medicare IM given:    Discharge Disposition:    Per UR Regulation:  Reviewed for med. necessity/level of care/duration of stay  Comments:  PCP- Copland  06/30/11 16: 39 Letha Cape RN, BSN [phone] patient now with ileus, will advance diet, should be ready for dc in couple of days.  06/25/11- 1525- Donn Pierini RN, BSN [phone] Pt for surgery today, lives at home with spouse, CM to follow post-op for potential d/c needs

## 2011-06-30 NOTE — Progress Notes (Signed)
5 Days Post-Op  Subjective: Feeling better, +BM  Objective: Vital signs in last 24 hours: Temp:  [97.2 F (36.2 C)-97.8 F (36.6 C)] 97.8 F (36.6 C) (12/10 0500) Pulse Rate:  [75-77] 77  (12/10 0500) Resp:  [18-20] 18  (12/10 0844) BP: (141-154)/(73-81) 141/80 mmHg (12/10 0500) SpO2:  [92 %-98 %] 95 % (12/10 0844) FiO2 (%):  [98 %] 98 % (12/09 1200) Last BM Date: 06/24/11  Intake/Output from previous day: 12/09 0701 - 12/10 0700 In: 2392.3 [P.O.:1120; I.V.:1272.3] Out: 3400 [Urine:3400] Intake/Output this shift:    General appearance: alert, cooperative and no distress Abd wound is clean and dry.  +BS, +BM, non tender, no distention.  Lab Results:  No results found for this basename: WBC:2,HGB:2,HCT:2,PLT:2 in the last 72 hours  BMET  [facility] [place] 06/29/11 0617 06/28/11 0932  NA 138 137  K 4.0 4.1  CL 104 101  CO2 30 30  GLUCOSE 130* 125*  BUN 7 8  CREATININE 1.00 0.97  CALCIUM 8.1* 8.7   PT/INR No results found for this basename: LABPROT:2,INR:2 in the last 72 hours   Studies/Results: No results found.  Anti-infectives: Anti-infectives     Start     Dose/Rate Route Frequency Ordered Stop   06/26/11 1000   ertapenem (INVANZ) 1 g in sodium chloride 0.9 % 50 mL IVPB  Status:  Discontinued        1 g 100 mL/hr over 30 Minutes Intravenous Every 24 hours 06/25/11 1828 06/26/11 0855   06/26/11 1000   ertapenem (INVANZ) 1 g in sodium chloride 0.9 % 50 mL IVPB        1 g 100 mL/hr over 30 Minutes Intravenous  Once 06/26/11 0855 06/26/11 1116   06/25/11 1000   ertapenem (INVANZ) 1 g in sodium chloride 0.9 % 50 mL IVPB  Status:  Discontinued        1 g 100 mL/hr over 30 Minutes Intravenous To Surgery 06/25/11 0956 06/25/11 1828   06/24/11 0900   ertapenem (INVANZ) 1 g in sodium chloride 0.9 % 50 mL IVPB        1 g 100 mL/hr over 30 Minutes Intravenous  Once 06/24/11 1313 06/25/11 1005         Current Facility-Administered Medications  Medication Dose Route  Frequency Provider Last Rate Last Dose  . acetaminophen (TYLENOL) tablet 650 mg  650 mg Oral Q6H PRN Nayana Abrol       Or  . acetaminophen (TYLENOL) suppository 650 mg  650 mg Rectal Q6H PRN Nayana Abrol      . albuterol (PROVENTIL) (5 MG/ML) 0.5% nebulizer solution 2.5 mg  2.5 mg Nebulization Q2H PRN Nayana Abrol      . alum & mag hydroxide-simeth (MAALOX/MYLANTA) 200-200-20 MG/5ML suspension 30 mL  30 mL Oral Q6H PRN Shanker Ghimire   30 mL at 06/27/11 0123  . antiseptic oral rinse (BIOTENE) solution 15 mL  15 mL Mouth Rinse q12n4p Sorin Laza   15 mL at 06/29/11 1600  . chlorhexidine (PERIDEX) 0.12 % solution 15 mL  15 mL Mouth/Throat BID Sorin Laza   15 mL at 06/30/11 0844  . dextrose 5 % and 0.45 % NaCl with KCl 20 mEq/L infusion   Intravenous Continuous Shanker Ghimire 40 mL/hr at 06/30/11 0616    . diphenhydrAMINE (BENADRYL) injection 12.5 mg  12.5 mg Intravenous Q6H PRN Marianna Fuss, PA       Or  . diphenhydrAMINE (BENADRYL) 12.5 MG/5ML elixir 12.5 mg  12.5 mg Oral  Q6H PRN Alisa Graff Bruning, PA      . enoxaparin (LOVENOX) injection 40 mg  40 mg Subcutaneous Daily Alisa Graff Bruning, PA   40 mg at 06/29/11 1235  . hydrALAZINE (APRESOLINE) injection 10 mg  10 mg Intravenous Q6H PRN Shanker Ghimire      . HYDROmorphone (DILAUDID) PCA injection 0.3 mg/mL   Intravenous Q4H Alisa Graff Bruning, PA      . insulin aspart (novoLOG) injection 0-15 Units  0-15 Units Subcutaneous TID [facility] Dickey Gave, PHARMD   2 Units at 06/28/11 1858  . metoCLOPramide (REGLAN) injection 5 mg  5 mg Intravenous Q8H Caleen Essex III, MD   5 mg at 06/30/11 [phone]  . metoprolol (LOPRESSOR) injection 2.5 mg  2.5 mg Intravenous Q6H Shanker Ghimire   2.5 mg at 06/30/11 0616  . naloxone [facility]) injection 0.4 mg  0.4 mg Intravenous PRN Marianna Fuss, PA       And  . sodium chloride 0.9 % injection 9 mL  9 mL Intravenous PRN Marianna Fuss, PA      . ondansetron (ZOFRAN) injection 4 mg  4 mg Intravenous Q6H PRN Marianna Fuss, PA      . pantoprazole (PROTONIX) injection 40 mg  40 mg Intravenous Q24H Sorin Laza   40 mg at 06/29/11 1826  . promethazine (PHENERGAN) tablet 12.5 mg  12.5 mg Oral Q6H PRN Nayana Abrol       Or  . promethazine (PHENERGAN) injection 12.5 mg  12.5 mg Intravenous Q6H PRN Nayana Abrol      . simvastatin (ZOCOR) tablet 40 mg  40 mg Oral QHS Nayana Abrol   40 mg at 06/30/11 0110    Assessment/Plan POD 5 Partial Colectomy for R colon Mass Patient Active Problem List  Diagnoses  . DIABETES MELLITUS, TYPE II  . HYPERLIPIDEMIA  . HYPERTENSION  . CORONARY ARTERY DISEASE  . GERD  . OTHER MALAISE AND FATIGUE  . DYSPHAGIA UNSPECIFIED  . HYPOTHYROIDISM, POST-RADIATION  . Cerumen impaction  . Colonic mass  Plan:  Taking clears going to fulls, mobilize more  Wean O2, oral pain meds  Defer home meds to Hospitalist.   LOS: 9 days    Victor Langenbach 06/30/2011

## 2011-06-30 NOTE — Progress Notes (Signed)
PATIENT DETAILS Name: Michael Holder Age: 65 y.o. Sex: male Date of Birth: 17-Nov-1945 Admit Date: 06/21/2011 ZOX:WRUEAVW Copland, MD, MD  Subjective: Tolerating diet, passing BM and flatus  Objective: Vital signs in last 24 hours: Filed Vitals:   06/29/11 2330 06/30/11 0500 06/30/11 0844 06/30/11 1500  BP:  141/80  118/72  Pulse:  77  78  Temp:  97.8 F (36.6 C)  98.2 F (36.8 C)  TempSrc:    Oral  Resp: 20 18 18 20   Height:      Weight:      SpO2: 97% 98% 95% 100%    Weight change:   Body mass index is 31.85 kg/(m^2).  Intake/Output from previous day:  Intake/Output Summary (Last 24 hours) at 06/30/11 1556 Last data filed at 06/30/11 1500  Gross per 24 hour  Intake   1667 ml  Output   3850 ml  Net  -2183 ml    PHYSICAL EXAM: Gen Exam: Awake and alert with clear speech.  Not in any acute distress. Neck: Supple, No JVD.   Chest: B/L Clear.   CVS: S1 S2 Regular, no murmurs.  Abdomen: soft, BS +, non tender, slightly more distended. Dressing in place at the incision site. Currently dry Extremities: Trace edema, lower extremities warm to touch. Neurologic: Non Focal.   Skin: No Rash.   Wounds: N/A.    CONSULTS:  general surgery  LAB RESULTS: CBC  Lab 06/27/11 0543 06/26/11 0640 06/25/11 0802  WBC 8.9 12.2* 8.1  HGB 12.4* 12.2* 12.2*  HCT 38.8* 38.3* 37.8*  PLT 139* 139* 122*  MCV 87.8 87.2 86.5  MCH 28.1 27.8 27.9  MCHC 32.0 31.9 32.3  RDW 13.7 13.7 13.4  LYMPHSABS -- -- --  MONOABS -- -- --  EOSABS -- -- --  BASOSABS -- -- --  BANDABS -- -- --    Chemistries   Lab 06/29/11 0617 06/28/11 0932 06/27/11 0543 06/26/11 0640 06/25/11 0802  NA 138 137 140 139 138  K 4.0 4.1 3.9 3.7 3.5  CL 104 101 105 106 104  CO2 30 30 29 28 26   GLUCOSE 130* 125* 140* 147* 125*  BUN 7 8 8  5* 4*  CREATININE 1.00 0.97 0.90 1.02 1.04  CALCIUM 8.1* 8.7 8.6 8.3* 8.0*  MG -- -- -- -- --    GFR Estimated Creatinine Clearance: 86.4 ml/min (by C-G formula based  on Cr of 1).  Coagulation profile No results found for this basename: INR:5,PROTIME:5 in the last 168 hours  Cardiac Enzymes No results found for this basename: CK:3,CKMB:3,TROPONINI:3,MYOGLOBIN:3 in the last 168 hours  No components found with this basename: POCBNP:3 No results found for this basename: DDIMER:2 in the last 72 hours No results found for this basename: HGBA1C:2 in the last 72 hours No results found for this basename: CHOL:2,HDL:2,LDLCALC:2,TRIG:2,CHOLHDL:2,LDLDIRECT:2 in the last 72 hours No results found for this basename: TSH,T4TOTAL,FREET3,T3FREE,THYROIDAB in the last 72 hours No results found for this basename: VITAMINB12:2,FOLATE:2,FERRITIN:2,TIBC:2,IRON:2,RETICCTPCT:2 in the last 72 hours No results found for this basename: LIPASE:2,AMYLASE:2 in the last 72 hours  Urine Studies No results found for this basename: UACOL:2,UAPR:2,USPG:2,UPH:2,UTP:2,UGL:2,UKET:2,UBIL:2,UHGB:2,UNIT:2,UROB:2,ULEU:2,UEPI:2,UWBC:2,URBC:2,UBAC:2,CAST:2,CRYS:2,UCOM:2,BILUA:2 in the last 72 hours  MICROBIOLOGY: No results found for this or any previous visit (from the past 240 hour(s)).  RADIOLOGY STUDIES/RESULTS: Dg Abd 1 View  06/23/2011  *RADIOLOGY REPORT*  Clinical Data: Nausea vomiting.  Abdominal pain.  ABDOMEN - 1 VIEW  Comparison: 06/21/2011  Findings: Supine view of the abdomen shows interval decrease in gaseous small bowel distention.  There is more air now visible in the left colon.  IMPRESSION: Interval decreasing gaseous small bowel distention with slightly more gas visible in the left colon.  Original Report Authenticated By: ERIC A. MANSELL, M.D.   Ct Angio Chest W/cm &/or Wo Cm  06/21/2011  *RADIOLOGY REPORT*  Clinical Data:  Hemoptysis and hypoxia  CT ANGIOGRAPHY CHEST WITH CONTRAST  Technique:  Multidetector CT imaging of the chest was performed using the standard protocol during bolus administration of intravenous contrast.  Multiplanar CT image reconstructions including MIPs  were obtained to evaluate the vascular anatomy.  Contrast:  119 ml Omni 300  Comparison:  November 10, 2005  Findings:  Biapical scarring is stable. Mild bibasilar pleural/ parenchymal scarring also appears unchanged. Otherwise, both lungs are clear.  There is no axillary, mediastinal, or hilar adenopathy. Mild cardiomegaly does not appear significantly changed.  There is a small hiatal hernia.  The bolus was not timed optimally for evaluation of pulmonary embolus but there are no gross filling defects within either pulmonary arterial system.  The thoracic aorta has a normal caliber and appearance.  Coronary artery and aortic atherosclerotic calcification is present.  There are no osseous lesions.  Review of the MIP images confirms the above findings.  IMPRESSION: There is stable pleural/ parenchymal scarring within both lungs. No acute abnormality.  Original Report Authenticated By: Brandon Melnick, M.D.   Ct Abdomen Pelvis W Contrast  06/21/2011  *RADIOLOGY REPORT*  Clinical Data: Abdominal bloating, vomiting, mid abdominal pain  CT ABDOMEN AND PELVIS WITH CONTRAST  Technique:  Multidetector CT imaging of the abdomen and pelvis was performed following the standard protocol during bolus administration of intravenous contrast.  Contrast:  119 ml Omni 300  Comparison: June 29, 2006  Findings: The stomach and small bowel are diffusely dilated extending to the terminal ileum.  The cecum and ascending colon are also fluid filled and dilated.  There is focal narrowing at the hepatic flexure but no gross associated soft tissue mass.  Dense opaque material is seen at the cecal level which may be related be related to prior appendectomy or small appendicoliths.  There is also a large stool burden at the rectum.  Left inguinal hernia is present which contains a loop of sigmoid colon.  This is nonobstructive.  There is descending and sigmoid diverticulosis without radiographic evidence of diverticulitis. There is no  adenopathy within the abdomen or pelvis there is several small scattered lymph nodes within the mesentery.  No free fluid.  There is a small hiatal hernia.  The liver, gallbladder, spleen, pancreas, adrenal glands, left kidney, urinary bladder have a normal appearance.  There is a small cortical cyst on the right kidney.  There are several diverticula at the base of the bladder. The old pelvic fractures are noticed with orthopedic fixation. Degenerative changes are present in the lower lumbar spine.  There are atherosclerotic calcifications within the aorta and takeoff vessels.  IMPRESSION: There is diffuse dilatation of the stomach, small bowel, and ascending colon with a focal narrowing and transition to normal caliber colon at the hepatic flexure level.  Even though there is no gross soft tissue mass, obstructing neoplasm cannot be excluded. Another consideration is an ischemic stricture, given the history of vascular disease in this patient.  Small hiatal hernia and nonobstructing left inguinal hernia.  Urinary bladder diverticula.  Original Report Authenticated By: Brandon Melnick, M.D.   Dg Abd Acute W/chest  06/21/2011  *RADIOLOGY REPORT*  Clinical Data: Abdominal bloating  and hemoptysis  ACUTE ABDOMEN SERIES (ABDOMEN 2 VIEW & CHEST 1 VIEW)  Comparison: CT abdomen 06/29/2006  Findings: Normal cardiac silhouette.  Chronic elevation of the left hemidiaphragm.  Lungs are clear.  There are several dilated loops of small bowel centrally within the abdomen measuring up to 4 cm.  There are several short air fluid levels within the small bowel.  No free air beneath hemidiaphragms. There is stool in the descending colon.  There is a large stool ball within the rectum measuring 8.5 cm.  The stomach is gas distended.  IMPRESSION:  1.  Dilated loops of small bowel which short air fluid levels suggest ileus versus early mechanical obstruction. 2.  Large stool ball within the rectum.  Cannot exclude a distal obstruction.   Original Report Authenticated By: Genevive Bi, M.D.    MEDICATIONS: Scheduled Meds:    . antiseptic oral rinse  15 mL Mouth Rinse q12n4p  . chlorhexidine  15 mL Mouth/Throat BID  . enoxaparin (LOVENOX) injection  40 mg Subcutaneous Daily  . HYDROmorphone PCA 0.3 mg/mL   Intravenous Q4H  . insulin aspart  0-15 Units Subcutaneous TID WC  . lisinopril  40 mg Oral Daily  . metoCLOPramide (REGLAN) injection  5 mg Intravenous Q8H  . pantoprazole  40 mg Oral Q1200  . simvastatin  40 mg Oral QHS  . DISCONTD: metoprolol  2.5 mg Intravenous Q6H  . DISCONTD: pantoprazole (PROTONIX) IV  40 mg Intravenous Q24H   Continuous Infusions:    . DISCONTD: dextrose 5 % and 0.45 % NaCl with KCl 20 mEq/L 40 mL/hr at 06/30/11 1350   PRN Meds:.acetaminophen, acetaminophen, albuterol, alum & mag hydroxide-simeth, diphenhydrAMINE, diphenhydrAMINE, hydrALAZINE, HYDROcodone-acetaminophen, naloxone, ondansetron (ZOFRAN) IV, promethazine, promethazine, sodium chloride  Antibiotics: Anti-infectives     Start     Dose/Rate Route Frequency Ordered Stop   06/26/11 1000   ertapenem (INVANZ) 1 g in sodium chloride 0.9 % 50 mL IVPB  Status:  Discontinued        1 g 100 mL/hr over 30 Minutes Intravenous Every 24 hours 06/25/11 1828 06/26/11 0855   06/26/11 1000   ertapenem (INVANZ) 1 g in sodium chloride 0.9 % 50 mL IVPB        1 g 100 mL/hr over 30 Minutes Intravenous  Once 06/26/11 0855 06/26/11 1116   06/25/11 1000   ertapenem (INVANZ) 1 g in sodium chloride 0.9 % 50 mL IVPB  Status:  Discontinued        1 g 100 mL/hr over 30 Minutes Intravenous To Surgery 06/25/11 0956 06/25/11 1828   06/24/11 0900   ertapenem (INVANZ) 1 g in sodium chloride 0.9 % 50 mL IVPB        1 g 100 mL/hr over 30 Minutes Intravenous  Once 06/24/11 1313 06/25/11 1005          Assessment/Plan: Patient Active Hospital Problem List: Colonic mass    Assessment:. 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 CT of  the abdomen and chest does not show any obvious metastases.   Plan: doing better, being advanced to a full diet, CT of the abdomen and chest does not show any metastases, perhaps further workup if any, can be done as an outpatient with oncology.  DIABETES MELLITUS, TYPE II    Assessment: CBGs stable    Plan: Continue with SSI. Resume metformin at discharge.  HYPERLIPIDEMIA    Assessment: Stable    Plan: Continue With Zocor.   HYPERTENSION    Assessment: Controlled  Plan: Discontinue IV Lopressor, restart lisinopril.  CORONARY ARTERY DISEASE  Assessment: Seems to have tolerated colectomy without any issues.    Plan: We'll continue to monitor this issue closely. Resume aspirin once okay with surgery.   GERD    Assessment: Stable    Plan: Continue with PPI-but change to oral.  Disposition: Remain inpatient.  DVT Prophylaxis: Bilateral SCDs for now.  Code Status: Full code.  Maretta Bees,  MD. 06/30/2011, 3:56 PM

## 2011-07-01 LAB — GLUCOSE, CAPILLARY
Glucose-Capillary: 96 mg/dL (ref 70–99)
Glucose-Capillary: 134 mg/dL — ABNORMAL HIGH (ref 70–99)
Glucose-Capillary: 120 mg/dL — ABNORMAL HIGH (ref 70–99)

## 2011-07-01 MED ORDER — TAMSULOSIN HCL 0.4 MG PO CAPS
0.4000 mg | ORAL_CAPSULE | Freq: Every day | ORAL | Status: DC
  Administered 2011-07-01 – 2011-07-02 (×2): 0.4 mg via ORAL
  Filled 2011-07-01 (×2): qty 1

## 2011-07-01 MED ORDER — HYDROCHLOROTHIAZIDE 25 MG PO TABS
12.5000 mg | ORAL_TABLET | Freq: Every day | ORAL | Status: DC
  Administered 2011-07-01 – 2011-07-02 (×2): 12.5 mg via ORAL
  Filled 2011-07-01 (×2): qty 0.5

## 2011-07-01 NOTE — Progress Notes (Signed)
PATIENT DETAILS Name: Michael Holder Age: 65 y.o. Sex: male Date of Birth: 1945/08/05 Admit Date: 06/21/2011 ZOX:WRUEAVW Copland, MD, MD  Interval history Patient is a 65 year old white male who was admitted on December 1 for abdominal pain. During his evaluation he was found to have a mass in the hepatic flexure on the CT scan of the abdomen and pelvis done on admission. Patient was seen consultation by [facility] gastroenterology.Colonoscopy was then subsequently done which showed a high-grade structure and with circumferential narrowing in the above mentioned area. Subsequently a surgical consultation was done and the patient underwent a right hemicolectomy. Postoperatively he has been doing well, he did not however tolerate his Foley catheter removal as he had significant residuals and the Foley catheter needed to be reinserted.His diet has slowly been advanced and for the first time today he has tolerated a regular diet. Current plans are to discharge him tomorrow after a voiding trial-following removal of his foley catheter.  Subjective: Tolerating diet, passing BM and flatus  Objective: Vital signs in last 24 hours: Filed Vitals:   07/01/11 0456 07/01/11 0505 07/01/11 0800 07/01/11 1343  BP:  157/77  141/80  Pulse:  92  83  Temp:  97.4 F (36.3 C)  98.7 F (37.1 C)  TempSrc:      Resp: 18 18 18 20   Height:      Weight:      SpO2: 96% 18% 94% 99%    Weight change:   Body mass index is 31.85 kg/(m^2).  Intake/Output from previous day:  Intake/Output Summary (Last 24 hours) at 07/01/11 1818 Last data filed at 07/01/11 1300  Gross per 24 hour  Intake    840 ml  Output   2600 ml  Net  -1760 ml    PHYSICAL EXAM: Gen Exam: Awake and alert with clear speech.  Not in any acute distress. Neck: Supple, No JVD.   Chest: B/L Clear.   CVS: S1 S2 Regular, no murmurs.  Abdomen: soft, BS +, non tender, slightly more distended. Dressing in place at the incision site. Currently  dry Extremities: Trace edema, lower extremities warm to touch. Neurologic: Non Focal.   Skin: No Rash.   Wounds: N/A.    CONSULTS:  general surgery GI  LAB RESULTS: CBC  Lab 06/27/11 0543 06/26/11 0640 06/25/11 0802  WBC 8.9 12.2* 8.1  HGB 12.4* 12.2* 12.2*  HCT 38.8* 38.3* 37.8*  PLT 139* 139* 122*  MCV 87.8 87.2 86.5  MCH 28.1 27.8 27.9  MCHC 32.0 31.9 32.3  RDW 13.7 13.7 13.4  LYMPHSABS -- -- --  MONOABS -- -- --  EOSABS -- -- --  BASOSABS -- -- --  BANDABS -- -- --    Chemistries   Lab 06/29/11 0617 06/28/11 0932 06/27/11 0543 06/26/11 0640 06/25/11 0802  NA 138 137 140 139 138  K 4.0 4.1 3.9 3.7 3.5  CL 104 101 105 106 104  CO2 30 30 29 28 26   GLUCOSE 130* 125* 140* 147* 125*  BUN 7 8 8  5* 4*  CREATININE 1.00 0.97 0.90 1.02 1.04  CALCIUM 8.1* 8.7 8.6 8.3* 8.0*  MG -- -- -- -- --    GFR Estimated Creatinine Clearance: 86.4 ml/min (by C-G formula based on Cr of 1).  Coagulation profile No results found for this basename: INR:5,PROTIME:5 in the last 168 hours  Cardiac Enzymes No results found for this basename: CK:3,CKMB:3,TROPONINI:3,MYOGLOBIN:3 in the last 168 hours  No components found with this basename: POCBNP:3 No  results found for this basename: DDIMER:2 in the last 72 hours No results found for this basename: HGBA1C:2 in the last 72 hours No results found for this basename: CHOL:2,HDL:2,LDLCALC:2,TRIG:2,CHOLHDL:2,LDLDIRECT:2 in the last 72 hours No results found for this basename: TSH,T4TOTAL,FREET3,T3FREE,THYROIDAB in the last 72 hours No results found for this basename: VITAMINB12:2,FOLATE:2,FERRITIN:2,TIBC:2,IRON:2,RETICCTPCT:2 in the last 72 hours No results found for this basename: LIPASE:2,AMYLASE:2 in the last 72 hours  Urine Studies No results found for this basename: UACOL:2,UAPR:2,USPG:2,UPH:2,UTP:2,UGL:2,UKET:2,UBIL:2,UHGB:2,UNIT:2,UROB:2,ULEU:2,UEPI:2,UWBC:2,URBC:2,UBAC:2,CAST:2,CRYS:2,UCOM:2,BILUA:2 in the last 72  hours  MICROBIOLOGY: No results found for this or any previous visit (from the past 240 hour(s)).  RADIOLOGY STUDIES/RESULTS: Dg Abd 1 View  06/23/2011  *RADIOLOGY REPORT*  Clinical Data: Nausea vomiting.  Abdominal pain.  ABDOMEN - 1 VIEW  Comparison: 06/21/2011  Findings: Supine view of the abdomen shows interval decrease in gaseous small bowel distention.  There is more air now visible in the left colon.  IMPRESSION: Interval decreasing gaseous small bowel distention with slightly more gas visible in the left colon.  Original Report Authenticated By: ERIC A. MANSELL, M.D.   Ct Angio Chest W/cm &/or Wo Cm  06/21/2011  *RADIOLOGY REPORT*  Clinical Data:  Hemoptysis and hypoxia  CT ANGIOGRAPHY CHEST WITH CONTRAST  Technique:  Multidetector CT imaging of the chest was performed using the standard protocol during bolus administration of intravenous contrast.  Multiplanar CT image reconstructions including MIPs were obtained to evaluate the vascular anatomy.  Contrast:  119 ml Omni 300  Comparison:  November 10, 2005  Findings:  Biapical scarring is stable. Mild bibasilar pleural/ parenchymal scarring also appears unchanged. Otherwise, both lungs are clear.  There is no axillary, mediastinal, or hilar adenopathy. Mild cardiomegaly does not appear significantly changed.  There is a small hiatal hernia.  The bolus was not timed optimally for evaluation of pulmonary embolus but there are no gross filling defects within either pulmonary arterial system.  The thoracic aorta has a normal caliber and appearance.  Coronary artery and aortic atherosclerotic calcification is present.  There are no osseous lesions.  Review of the MIP images confirms the above findings.  IMPRESSION: There is stable pleural/ parenchymal scarring within both lungs. No acute abnormality.  Original Report Authenticated By: Brandon Melnick, M.D.   Ct Abdomen Pelvis W Contrast  06/21/2011  *RADIOLOGY REPORT*  Clinical Data: Abdominal bloating,  vomiting, mid abdominal pain  CT ABDOMEN AND PELVIS WITH CONTRAST  Technique:  Multidetector CT imaging of the abdomen and pelvis was performed following the standard protocol during bolus administration of intravenous contrast.  Contrast:  119 ml Omni 300  Comparison: June 29, 2006  Findings: The stomach and small bowel are diffusely dilated extending to the terminal ileum.  The cecum and ascending colon are also fluid filled and dilated.  There is focal narrowing at the hepatic flexure but no gross associated soft tissue mass.  Dense opaque material is seen at the cecal level which may be related be related to prior appendectomy or small appendicoliths.  There is also a large stool burden at the rectum.  Left inguinal hernia is present which contains a loop of sigmoid colon.  This is nonobstructive.  There is descending and sigmoid diverticulosis without radiographic evidence of diverticulitis. There is no adenopathy within the abdomen or pelvis there is several small scattered lymph nodes within the mesentery.  No free fluid.  There is a small hiatal hernia.  The liver, gallbladder, spleen, pancreas, adrenal glands, left kidney, urinary bladder have a normal appearance.  There  is a small cortical cyst on the right kidney.  There are several diverticula at the base of the bladder. The old pelvic fractures are noticed with orthopedic fixation. Degenerative changes are present in the lower lumbar spine.  There are atherosclerotic calcifications within the aorta and takeoff vessels.  IMPRESSION: There is diffuse dilatation of the stomach, small bowel, and ascending colon with a focal narrowing and transition to normal caliber colon at the hepatic flexure level.  Even though there is no gross soft tissue mass, obstructing neoplasm cannot be excluded. Another consideration is an ischemic stricture, given the history of vascular disease in this patient.  Small hiatal hernia and nonobstructing left inguinal hernia.   Urinary bladder diverticula.  Original Report Authenticated By: Brandon Melnick, M.D.   Dg Abd Acute W/chest  06/21/2011  *RADIOLOGY REPORT*  Clinical Data: Abdominal bloating and hemoptysis  ACUTE ABDOMEN SERIES (ABDOMEN 2 VIEW & CHEST 1 VIEW)  Comparison: CT abdomen 06/29/2006  Findings: Normal cardiac silhouette.  Chronic elevation of the left hemidiaphragm.  Lungs are clear.  There are several dilated loops of small bowel centrally within the abdomen measuring up to 4 cm.  There are several short air fluid levels within the small bowel.  No free air beneath hemidiaphragms. There is stool in the descending colon.  There is a large stool ball within the rectum measuring 8.5 cm.  The stomach is gas distended.  IMPRESSION:  1.  Dilated loops of small bowel which short air fluid levels suggest ileus versus early mechanical obstruction. 2.  Large stool ball within the rectum.  Cannot exclude a distal obstruction.  Original Report Authenticated By: Genevive Bi, M.D.    MEDICATIONS: Scheduled Meds:    . enoxaparin (LOVENOX) injection  40 mg Subcutaneous Daily  . insulin aspart  0-15 Units Subcutaneous TID WC  . lisinopril  40 mg Oral Daily  . metoCLOPramide (REGLAN) injection  5 mg Intravenous Q8H  . pantoprazole  40 mg Oral Q1200  . simvastatin  40 mg Oral QHS  . Tamsulosin HCl  0.4 mg Oral Daily  . DISCONTD: antiseptic oral rinse  15 mL Mouth Rinse q12n4p  . DISCONTD: chlorhexidine  15 mL Mouth/Throat BID  . DISCONTD: HYDROmorphone PCA 0.3 mg/mL   Intravenous Q4H   Continuous Infusions:   PRN Meds:.acetaminophen, acetaminophen, albuterol, alum & mag hydroxide-simeth, diphenhydrAMINE, diphenhydrAMINE, hydrALAZINE, HYDROcodone-acetaminophen, naloxone, ondansetron (ZOFRAN) IV, promethazine, promethazine, sodium chloride  Antibiotics: Anti-infectives     Start     Dose/Rate Route Frequency Ordered Stop   06/26/11 1000   ertapenem (INVANZ) 1 g in sodium chloride 0.9 % 50 mL IVPB  Status:   Discontinued        1 g 100 mL/hr over 30 Minutes Intravenous Every 24 hours 06/25/11 1828 06/26/11 0855   06/26/11 1000   ertapenem (INVANZ) 1 g in sodium chloride 0.9 % 50 mL IVPB        1 g 100 mL/hr over 30 Minutes Intravenous  Once 06/26/11 0855 06/26/11 1116   06/25/11 1000   ertapenem (INVANZ) 1 g in sodium chloride 0.9 % 50 mL IVPB  Status:  Discontinued        1 g 100 mL/hr over 30 Minutes Intravenous To Surgery 06/25/11 0956 06/25/11 1828   06/24/11 0900   ertapenem (INVANZ) 1 g in sodium chloride 0.9 % 50 mL IVPB        1 g 100 mL/hr over 30 Minutes Intravenous  Once 06/24/11 1313 06/25/11 1005  Assessment/Plan: Patient Active Hospital Problem List: Colonic mass    Assessment:. 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 CT of the abdomen and chest does not show any obvious metastases.   Plan: doing better, Tolerating a regular diet, per Surgery note-they will arrange outpatient Oncology follow up.  DIABETES MELLITUS, TYPE II    Assessment: CBGs stable    Plan: Continue with SSI. Resume metformin at discharge.  HYPERLIPIDEMIA    Assessment: Stable    Plan: Continue With Zocor.   HYPERTENSION    Assessment: Uncontrolled    Plan:continue with lisinopril.Will add HCTZ.  CORONARY ARTERY DISEASE  Assessment: Seems to have tolerated colectomy without any issues.    Plan: We'll continue to monitor this issue closely. Resume aspirin once okay with surgery.   GERD    Assessment: Stable    Plan: Continue with PPI-but change to oral.  Disposition: Remain inpatient-Home tomorrow-if continues to tolerate a regular diet and does well on the voiding trial.  DVT Prophylaxis: Bilateral SCDs for now.  Code Status: Full code.  Maretta Bees,  MD. 07/01/2011, 6:18 PM

## 2011-07-01 NOTE — Progress Notes (Addendum)
Mobilize Advance diet Flomax/ d/c foley Progressing towards discharge  Michael Holder. Corliss Skains, MD, [facility] Surgery  07/01/2011 9:43 AM   I spoke with the patient about his pathology report.  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  He will need Oncology referral as an outpatient, which will be arranged through Dr. Carollee Massed nurse.  Michael Holder. Corliss Skains, MD, [facility] Surgery  07/01/2011 2:38 PM

## 2011-07-01 NOTE — Progress Notes (Signed)
Patient ID: Michael Holder, male   DOB: [birth year], 65 y.o.   MRN: [id number]   Subjective: Feeling better, +BM  Objective: Vital signs in last 24 hours: Temp:  [97.4 F (36.3 C)-98.2 F (36.8 C)] 97.4 F (36.3 C) (12/11 0505) Pulse Rate:  [72-92] 92  (12/11 0505) Resp:  [14-20] 18  (12/11 0800) BP: (118-186)/(69-82) 157/77 mmHg (12/11 0505) SpO2:  [18 %-100 %] 94 % (12/11 0800) FiO2 (%):  [98 %] 98 % (12/10 1156) Last BM Date: 06/30/11  Intake/Output from previous day: 12/10 0701 - 12/11 0700 In: 1381 [P.O.:1080; I.V.:301] Out: 2350 [Urine:2350] Intake/Output this shift:    General appearance: alert, cooperative and no distress Abd wound is clean and dry.  +BS, +BM, non tender, no distention. Up in chair again, foley in for Urinary retention earlier in course. LE: Edema in both Lower legs. Lab Results:  No results found for this basename: WBC:2,HGB:2,HCT:2,PLT:2 in the last 72 hours  BMET  [facility] 06/29/11 0617 06/28/11 0932  NA 138 137  K 4.0 4.1  CL 104 101  CO2 30 30  GLUCOSE 130* 125*  BUN 7 8  CREATININE 1.00 0.97  CALCIUM 8.1* 8.7   PT/INR No results found for this basename: LABPROT:2,INR:2 in the last 72 hours   Studies/Results: No results found.  Anti-infectives: Anti-infectives     Start     Dose/Rate Route Frequency Ordered Stop   06/26/11 1000   ertapenem (INVANZ) 1 g in sodium chloride 0.9 % 50 mL IVPB  Status:  Discontinued        1 g 100 mL/hr over 30 Minutes Intravenous Every 24 hours 06/25/11 1828 06/26/11 0855   06/26/11 1000   ertapenem (INVANZ) 1 g in sodium chloride 0.9 % 50 mL IVPB        1 g 100 mL/hr over 30 Minutes Intravenous  Once 06/26/11 0855 06/26/11 1116   06/25/11 1000   ertapenem (INVANZ) 1 g in sodium chloride 0.9 % 50 mL IVPB  Status:  Discontinued        1 g 100 mL/hr over 30 Minutes Intravenous To Surgery 06/25/11 0956 06/25/11 1828   06/24/11 0900   ertapenem (INVANZ) 1 g in sodium chloride 0.9 % 50 mL IVPB       1 g 100 mL/hr over 30 Minutes Intravenous  Once 06/24/11 1313 06/25/11 1005         Current Facility-Administered Medications  Medication Dose Route Frequency Provider Last Rate Last Dose  . acetaminophen (TYLENOL) tablet 650 mg  650 mg Oral Q6H PRN Nayana Abrol       Or  . acetaminophen (TYLENOL) suppository 650 mg  650 mg Rectal Q6H PRN Nayana Abrol      . albuterol (PROVENTIL) (5 MG/ML) 0.5% nebulizer solution 2.5 mg  2.5 mg Nebulization Q2H PRN Nayana Abrol      . alum & mag hydroxide-simeth (MAALOX/MYLANTA) 200-200-20 MG/5ML suspension 30 mL  30 mL Oral Q6H PRN Shanker Ghimire   30 mL at 06/27/11 0123  . diphenhydrAMINE (BENADRYL) injection 12.5 mg  12.5 mg Intravenous Q6H PRN Marianna Fuss, PA       Or  . diphenhydrAMINE (BENADRYL) 12.5 MG/5ML elixir 12.5 mg  12.5 mg Oral Q6H PRN Marianna Fuss, PA      . enoxaparin (LOVENOX) injection 40 mg  40 mg Subcutaneous Daily Alisa Graff Bruning, PA   40 mg at 06/30/11 1025  . hydrALAZINE (APRESOLINE) injection 10 mg  10 mg Intravenous Q6H  PRN Shanker Ghimire   10 mg at 07/01/11 0203  . HYDROcodone-acetaminophen (NORCO) 5-325 MG per tablet 1-2 tablet  1-2 tablet Oral Q4H PRN Sherrie George, PA      . HYDROmorphone (DILAUDID) PCA injection 0.3 mg/mL   Intravenous Q4H Alisa Graff Bruning, PA      . insulin aspart (novoLOG) injection 0-15 Units  0-15 Units Subcutaneous TID [facility] Dickey Gave, PHARMD   2 Units at 06/28/11 1858  . lisinopril (PRINIVIL,ZESTRIL) tablet 40 mg  40 mg Oral Daily Shanker Ghimire   40 mg at 06/30/11 1708  . metoCLOPramide (REGLAN) injection 5 mg  5 mg Intravenous Q8H Caleen Essex III, MD   5 mg at 07/01/11 4540  . naloxone [facility]) injection 0.4 mg  0.4 mg Intravenous PRN Marianna Fuss, PA       And  . sodium chloride 0.9 % injection 9 mL  9 mL Intravenous PRN Marianna Fuss, PA      . ondansetron (ZOFRAN) injection 4 mg  4 mg Intravenous Q6H PRN Marianna Fuss, PA      . pantoprazole (PROTONIX) EC tablet 40 mg   40 mg Oral Q1200 Shanker Ghimire   40 mg at 06/30/11 1708  . promethazine (PHENERGAN) tablet 12.5 mg  12.5 mg Oral Q6H PRN Nayana Abrol       Or  . promethazine (PHENERGAN) injection 12.5 mg  12.5 mg Intravenous Q6H PRN Nayana Abrol      . simvastatin (ZOCOR) tablet 40 mg  40 mg Oral QHS Nayana Abrol   40 mg at 06/30/11 2206  . DISCONTD: antiseptic oral rinse (BIOTENE) solution 15 mL  15 mL Mouth Rinse q12n4p Sorin Laza   15 mL at 06/30/11 1659  . DISCONTD: chlorhexidine (PERIDEX) 0.12 % solution 15 mL  15 mL Mouth/Throat BID Sorin Laza   15 mL at 06/30/11 2039  . DISCONTD: dextrose 5 % and 0.45 % NaCl with KCl 20 mEq/L infusion   Intravenous Continuous Shanker Ghimire 40 mL/hr at 06/30/11 1350    . DISCONTD: metoprolol (LOPRESSOR) injection 2.5 mg  2.5 mg Intravenous Q6H Shanker Ghimire   2.5 mg at 06/30/11 1156  . DISCONTD: pantoprazole (PROTONIX) injection 40 mg  40 mg Intravenous Q24H Sorin Laza   40 mg at 06/29/11 1826    Assessment/Plan POD 6 Partial Colectomy for R colon Mass Patient Active Problem List  Diagnoses  . DIABETES MELLITUS, TYPE II  . HYPERLIPIDEMIA  . HYPERTENSION  . CORONARY ARTERY DISEASE  . GERD  . OTHER MALAISE AND FATIGUE  . DYSPHAGIA UNSPECIFIED  . HYPOTHYROIDISM, POST-RADIATION  . Cerumen impaction  . Colonic mass  Plan:  Taking full liquids, will go to low residual soft diet.  D/c foley and start flomax. He needs help walking in halls, be sure he has OT/PT.  Andriana Casa 07/01/2011

## 2011-07-01 NOTE — Progress Notes (Signed)
Utilization review completed.  
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
## 2011-07-01 NOTE — Progress Notes (Signed)
Seen and agree with SPT note Jonique Kulig Tabor, PT [phone]  

## 2011-07-02 LAB — BASIC METABOLIC PANEL
CO2: 31 mEq/L (ref 19–32)
Chloride: 101 mEq/L (ref 96–112)
Creatinine, Ser: 0.99 mg/dL (ref 0.50–1.35)
Glucose, Bld: 116 mg/dL — ABNORMAL HIGH (ref 70–99)
Sodium: 140 mEq/L (ref 135–145)

## 2011-07-02 LAB — GLUCOSE, CAPILLARY
Glucose-Capillary: 110 mg/dL — ABNORMAL HIGH (ref 70–99)
Glucose-Capillary: 118 mg/dL — ABNORMAL HIGH (ref 70–99)

## 2011-07-02 MED ORDER — HYDROCODONE-ACETAMINOPHEN 5-325 MG PO TABS: 1.0000 | ORAL_TABLET | ORAL | Status: AC | PRN

## 2011-07-02 NOTE — Progress Notes (Signed)
Stable from surgical standpoint. Discharge per medical team. Instructions on AVS.  Wilmon Arms. Corliss Skains, MD, [facility]  07/02/2011 3:16 PM

## 2011-07-02 NOTE — ED Provider Notes (Signed)
Medical screening examination/treatment/procedure(s) were conducted as a shared visit with non-physician practitioner(s) and myself.  I personally evaluated the patient during the encounter.  [phone] with hemoptysis and abdominal pain. CTA chest neg. CT abdomen with dilation narrowing to point of hepatic flexure with normal caliber colon distally. PA, SW,  discussed with surgery who did not feel that this was surgical issue. Pt admitted to medicine for further evaluation. Of note, pt had R hemicolectomy 4 days later for resection of mass.    Raeford Razor, MD 07/02/11 0157

## 2011-07-02 NOTE — Discharge Summary (Signed)
HOSPITAL DISCHARGE SUMMARY  Michael Holder  MRN: [id number]  DOB:10-16-1945  Date of Admission: 06/21/2011 Date of Discharge: 07/02/2011         LOS: 11 days   Attending Physician:Noha Karasik A  Patient's [name], MD, MD  Consults: Dr. Janee Morn  Discharge Diagnoses: Present on Admission:  .DIABETES MELLITUS, TYPE II .HYPERLIPIDEMIA .HYPERTENSION .CORONARY ARTERY DISEASE .GERD .Abdominal pain .Nausea and vomiting .Hemoptysis .Colonic mass   Current Discharge Medication List    START taking these medications   Details  HYDROcodone-acetaminophen (NORCO) 5-325 MG per tablet Take 1-2 tablets by mouth every 4 (four) hours as needed. Qty: 30 tablet, Refills: 0      CONTINUE these medications which have NOT CHANGED   Details  aspirin 81 MG EC tablet Take 81 mg by mouth daily.      fish oil-omega-3 fatty acids 1000 MG capsule Take 2 g by mouth daily. Omega red fish oil    lisinopril (PRINIVIL,ZESTRIL) 40 MG tablet Take 40 mg by mouth daily.      metFORMIN (GLUCOPHAGE) 500 MG tablet Take 500 mg by mouth daily.      Multiple Vitamin (MULTIVITAMIN) tablet Take 1 tablet by mouth daily.      simvastatin (ZOCOR) 40 MG tablet Take 40 mg by mouth at bedtime.      Tamsulosin HCl (FLOMAX) 0.4 MG CAPS Take 1 capsule (0.4 mg total) by mouth daily after breakfast. Qty: 90 capsule, Refills: 4          Brief Admission History: 65 year old male with a history of diabetes, hypertension, hyperlipidemia, throat cancer in remission, and a remote history of cigarette use who presents with dark bloody hemoptysis x2 episodes this morning. He denies any fever, chills, chest pain, shortness of breath, or cough. There are no known aggravating or alleviating factors. There has been no prior treatment. He has no history of a DVT or PE; he has no recent prolonged road trip or air travel.  The patient also reports 2 months of abdominal bloating. This is associated with epigastric  discomfort that is sharp and intermittent in nature. It is associated with nausea, increased flatus, increased belching; there is no vomiting, diarrhea, but has had a lot of constipation. Per family, he has been seen and evaluated for this complaint 3 times at his PCPs office, last visit was on the 23rd of last month, since its onset he has had intermittent use of laxatives with no change in the symptoms. Last bowel movement was today and was normal for him. There are no aggravating or alleviating factors. She has used milk of magnesia and for constipation. He denies any chest pain shortness of breath orthopnea paroxysmal nocturnal dyspnea or dependent edema. He denies any fever chills or riGOrs. He has lost about 10-15 pounds in the last one year.  Hospital Course: Present on Admission:  .DIABETES MELLITUS, TYPE II .HYPERLIPIDEMIA .HYPERTENSION .CORONARY ARTERY DISEASE .GERD .Abdominal pain .Nausea and vomiting .Hemoptysis .Colonic mass:  1. Colonic mass: Patient admitted to the hospital for increasing bloating, flatus and constipation. CT scan of abdomen showed colonic mass in the hepatic flexure. 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 General surgery was consulted and right hemicolectomy was done on December 5 by Dr. Janee Morn. Afterwards patient recovered okay. He tolerating clear liquids which was advanced to full diet. Patient was tolerating that very well and having bowel movements and the per surgical team he thought he might be able to go home safely. The biopsy showed  adenocarcinoma with clean margins. Per general surgery notes, and they will arrange outpatient oncology followup.  2. Diabetes mellitus type 2: This is been very controlled during the hospital stay because sometimes patient was n.p.o. his home medication was continued the time of discharge his metformin was restarted.  3. Hypertension this is being controlled.  4. Urinary retention: Of  post operatively patient had Foley catheter. At the time of the catheter withdrawal he did develop urinary retention. The catheter was placed again and he was on Flomax and then patient was able to urinate without assistance. Please note that if the patient developed the urinary retention again he might probably needs referral to a urologist.    Day of Discharge BP 155/76  Pulse 80  Temp(Src) 98 F (36.7 C) (Oral)  Resp 20  Ht 5\' 10"  (1.778 m)  Wt 100.699 kg (222 lb)  BMI 31.85 kg/m2  SpO2 100% Physical Exam: GEN: No acute distress, cooperative with exam PSYCH: He is alert and oriented x4; does not appear anxious does not appear depressed; affect is normal  HEENT: Mucous membranes pink and anicteric;  Mouth: without oral thrush or lesions Eyes: PERRLA; EOM intact;  Neck: no cervical lymphadenopathy nor thyromegaly or carotid bruit; no JVD;  CHEST WALL: No tenderness, symmetrical to breathing bilaterally CHEST: Normal respiration, clear to auscultation bilaterally  HEART: Regular rate and rhythm; no murmurs, rubs or gallops, S1 and S2 heard  BACK: No kyphosis or scoliosis; no CVA tenderness  ABDOMEN:  soft non-tender; no masses, no organomegaly, normal abdominal bowel sounds; no pannus; no intertriginous candida.  EXTREMITIES: No bone or joint deformity; no edema; no ulcerations.  PULSES: 2+ and symmetric, neurovascularity is intact SKIN: Normal hydration no rash or ulceration, no flushing or suspicious lesions  CNS: Cranial nerves 2-12 grossly intact no focal neurologic deficit, coordination is intact gait not tested  BMET    Component Value Date/Time   NA 140 07/02/2011 0645   K 3.5 07/02/2011 0645   CL 101 07/02/2011 0645   CO2 31 07/02/2011 0645   GLUCOSE 116* 07/02/2011 0645   BUN 7 07/02/2011 0645   CREATININE 0.99 07/02/2011 0645   CREATININE 1.47* 06/13/2011 1318   CALCIUM 8.8 07/02/2011 0645   GFRNONAA 83* 07/02/2011 0645   GFRAA >90 07/02/2011 0645      Disposition: Home   Follow-up Appts: Discharge Orders    Future Appointments: Provider: Department: Dept Phone: Center:   09/02/2011 8:00 AM Hannah Beat, MD [facility] [phone] [id number]   12/11/2011 3:00 PM [facility] [phone] [facility]   12/11/2011 4:00 PM [facility] [phone] [facility]     Future Orders Please Complete By Expires   Diet Carb Modified      Increase activity slowly         Follow-up Information    Follow up with Hannah Beat, MD. Make an appointment in 1 week.      Follow up with Liz Malady, MD. Make an appointment in 5 days. (For staple removal, it can be with office Nurse)    Contact information:   [facility] [address] [place] [address] [phone]       Follow up with Liz Malady, MD. Make an appointment in 2 weeks. (To See DR. Janee Morn)    Contact information:   [facility] [address] [place] [address] [phone]          I  spent 40 minutes completing paperwork and coordinating discharge efforts.  SignedClydia Llano A 07/02/2011, 4:31 PM

## 2011-07-02 NOTE — Progress Notes (Signed)
This patient was discussed at long LOS rounds this morning. 

## 2011-07-02 NOTE — Progress Notes (Signed)
Occupational Therapy  Orders received for OT consult, went in and discussed the role of OT with the pt and his spouse.  He reports being at baseline for selfcare and toileting.  He reports that his wife occassionaly assists with toilet hygiene and selfcare but doesn't have to all of the time.  Discussed briefly about AE for toileting, which pt and wife are familiar with.  Pt also reports that he is looking for a back scrubber.  No further OT needs.  Pt and wife also agree.  Perrin Maltese, OTR/L Pager number [phone] 07/02/2011

## 2011-07-02 NOTE — Progress Notes (Signed)
Discharge home. Home discharge instruction given, no questions verbalized. Patient is alert and oriented, not in any distress,incision intact.denies any pain.

## 2011-07-02 NOTE — Progress Notes (Signed)
Patient ID: Michael Holder, male   DOB: 1946/07/19, 66 y.o.   MRN: [id number]   Subjective: Feeling better, +BM, foley is out and he's able to void on his own. Pain well controlled he doesn't like pain meds. Objective: Vital signs in last 24 hours: Temp:  [98.2 F (36.8 C)-98.7 F (37.1 C)] 98.2 F (36.8 C) (12/12 0546) Pulse Rate:  [71-83] 71  (12/12 0546) Resp:  [17-20] 18  (12/12 0546) BP: (131-141)/(79-89) 132/79 mmHg (12/12 0546) SpO2:  [96 %-99 %] 96 % (12/12 0546) Last BM Date: 07/01/11  Intake/Output from previous day: 12/11 0701 - 12/12 0700 In: 840 [P.O.:840] Out: 2401 [Urine:2400; Stool:1] Intake/Output this shift: Total I/O In: 360 [P.O.:360] Out: 300 [Urine:300]  General appearance: alert, cooperative and no distress Abd wound is clean and dry.  +BS, +BM, non tender, no distention. Up in chair again,  He was able to void.  LE: Edema in both Lower legs. Lab Results:  No results found for this basename: WBC:2,HGB:2,HCT:2,PLT:2 in the last 72 hours  BMET  [facility] 07/02/11 0645  NA 140  K 3.5  CL 101  CO2 31  GLUCOSE 116*  BUN 7  CREATININE 0.99  CALCIUM 8.8   PT/INR No results found for this basename: LABPROT:2,INR:2 in the last 72 hours   Studies/Results: No results found.  Anti-infectives: Anti-infectives     Start     Dose/Rate Route Frequency Ordered Stop   06/26/11 1000   ertapenem (INVANZ) 1 g in sodium chloride 0.9 % 50 mL IVPB  Status:  Discontinued        1 g 100 mL/hr over 30 Minutes Intravenous Every 24 hours 06/25/11 1828 06/26/11 0855   06/26/11 1000   ertapenem (INVANZ) 1 g in sodium chloride 0.9 % 50 mL IVPB        1 g 100 mL/hr over 30 Minutes Intravenous  Once 06/26/11 0855 06/26/11 1116   06/25/11 1000   ertapenem (INVANZ) 1 g in sodium chloride 0.9 % 50 mL IVPB  Status:  Discontinued        1 g 100 mL/hr over 30 Minutes Intravenous To Surgery 06/25/11 0956 06/25/11 1828   06/24/11 0900   ertapenem (INVANZ) 1 g in sodium  chloride 0.9 % 50 mL IVPB        1 g 100 mL/hr over 30 Minutes Intravenous  Once 06/24/11 1313 06/25/11 1005         Current Facility-Administered Medications  Medication Dose Route Frequency Provider Last Rate Last Dose  . acetaminophen (TYLENOL) tablet 650 mg  650 mg Oral Q6H PRN Nayana Abrol       Or  . acetaminophen (TYLENOL) suppository 650 mg  650 mg Rectal Q6H PRN Nayana Abrol      . albuterol (PROVENTIL) (5 MG/ML) 0.5% nebulizer solution 2.5 mg  2.5 mg Nebulization Q2H PRN Nayana Abrol      . alum & mag hydroxide-simeth (MAALOX/MYLANTA) 200-200-20 MG/5ML suspension 30 mL  30 mL Oral Q6H PRN Shanker Ghimire   30 mL at 06/27/11 0123  . diphenhydrAMINE (BENADRYL) injection 12.5 mg  12.5 mg Intravenous Q6H PRN Marianna Fuss, PA       Or  . diphenhydrAMINE (BENADRYL) 12.5 MG/5ML elixir 12.5 mg  12.5 mg Oral Q6H PRN Marianna Fuss, PA      . enoxaparin (LOVENOX) injection 40 mg  40 mg Subcutaneous Daily Marianna Fuss, PA   40 mg at 07/02/11 0959  . hydrALAZINE (APRESOLINE) injection 10  mg  10 mg Intravenous Q6H PRN Shanker Ghimire   10 mg at 07/01/11 0203  . hydrochlorothiazide (HYDRODIURIL) tablet 12.5 mg  12.5 mg Oral Daily Shanker Ghimire   12.5 mg at 07/02/11 0958  . HYDROcodone-acetaminophen (NORCO) 5-325 MG per tablet 1-2 tablet  1-2 tablet Oral Q4H PRN Sherrie George, PA      . insulin aspart (novoLOG) injection 0-15 Units  0-15 Units Subcutaneous TID [facility] Dickey Gave, PHARMD   2 Units at 07/01/11 1821  . lisinopril (PRINIVIL,ZESTRIL) tablet 40 mg  40 mg Oral Daily Shanker Ghimire   40 mg at 07/02/11 0958  . metoCLOPramide (REGLAN) injection 5 mg  5 mg Intravenous Q8H Caleen Essex III, MD   5 mg at 07/01/11 2212  . naloxone [facility]) injection 0.4 mg  0.4 mg Intravenous PRN Marianna Fuss, PA       And  . sodium chloride 0.9 % injection 9 mL  9 mL Intravenous PRN Marianna Fuss, PA      . ondansetron (ZOFRAN) injection 4 mg  4 mg Intravenous Q6H PRN Marianna Fuss,  PA      . pantoprazole (PROTONIX) EC tablet 40 mg  40 mg Oral Q1200 Shanker Ghimire   40 mg at 07/01/11 1241  . promethazine (PHENERGAN) tablet 12.5 mg  12.5 mg Oral Q6H PRN Nayana Abrol       Or  . promethazine (PHENERGAN) injection 12.5 mg  12.5 mg Intravenous Q6H PRN Nayana Abrol      . simvastatin (ZOCOR) tablet 40 mg  40 mg Oral QHS Nayana Abrol   40 mg at 07/01/11 2212  . Tamsulosin HCl (FLOMAX) capsule 0.4 mg  0.4 mg Oral Daily Sherrie George, [place]   0.4 mg at 07/02/11 1610    Assessment/Plan POD 6 Partial Colectomy for R colon Mass Patient Active Problem List  Diagnoses  . DIABETES MELLITUS, TYPE II  . HYPERLIPIDEMIA  . HYPERTENSION  . CORONARY ARTERY DISEASE  . GERD  . OTHER MALAISE AND FATIGUE  . DYSPHAGIA UNSPECIFIED  . HYPOTHYROIDISM, POST-RADIATION  . Cerumen impaction  . Colonic mass  Plan:  Soft diet,  foley out and voiding well.  His incision looks good.  He is still on insulin, not metformin, and still has significant lower extremity swelling. From our standpoint he can go home when medically ready.  I have info for him to call and get staples out next week and see Dr. Janee Morn in 2 weeks. We will arrange follow-up with oncology after he sees Dr. Janee Morn.  Info for follow up and wound care in AVS.  Call me if I can help with any d/c instructions not already on AVS.  [phone]) D/c reglan.                                     Etter Royall 07/02/2011

## 2011-07-03 NOTE — Progress Notes (Signed)
   CARE MANAGEMENT NOTE 07/03/2011  Patient:  Michael Holder, Michael Holder   Account Number:  [id number]  Date Initiated:  06/25/2011  Documentation initiated by:  Donn Pierini  Subjective/Objective Assessment:   Pt admitted with abd pain, awaiting colectomy- scheduled for 06/25/11     Action/Plan:   PTA pt lived at home with spouse, was independent with ADLs   Anticipated DC Date:  07/02/2011   Anticipated DC Plan:  HOME/SELF CARE      DC Planning Services  CM consult      Choice offered to / List presented to:             Status of service:  Completed, signed off Medicare Important Message given?   (If response is "NO", the following Medicare IM given date fields will be blank) Date Medicare IM given:   Date Additional Medicare IM given:    Discharge Disposition:  HOME/SELF CARE  Per UR Regulation:  Reviewed for med. necessity/level of care/duration of stay  Comments:  PCP- Cop[place]  07/03/11 10:07 Letha Cape RN, BSN [phone] patient was able to tolerate regular diet, was discharged to home, no pt or ot needs.  06/30/11 16: 39 Letha Cape RN, BSN [phone] patient now with ileus, will advance diet, should be ready for dc in couple of days.  06/25/11- 1525- Donn Pierini RN, BSN [phone] Pt for surgery today, lives at home with spouse, CM to follow post-op for potential d/c needs

## 2011-07-09 ENCOUNTER — Ambulatory Visit (INDEPENDENT_AMBULATORY_CARE_PROVIDER_SITE_OTHER): Payer: Medicare Other | Admitting: Family Medicine

## 2011-07-09 ENCOUNTER — Ambulatory Visit (INDEPENDENT_AMBULATORY_CARE_PROVIDER_SITE_OTHER): Payer: Medicare Other | Admitting: General Surgery

## 2011-07-09 ENCOUNTER — Encounter: Payer: Self-pay | Admitting: Family Medicine

## 2011-07-09 DIAGNOSIS — C189 Malignant neoplasm of colon, unspecified: Secondary | ICD-10-CM

## 2011-07-09 DIAGNOSIS — Z4802 Encounter for removal of sutures: Secondary | ICD-10-CM

## 2011-07-09 NOTE — Patient Instructions (Signed)
F/u late Feb or March

## 2011-07-09 NOTE — Progress Notes (Signed)
  Patient Name: Michael Holder Date of Birth: Jun 29, 1946 Age: 65 y.o. Medical Record Number: [id number] Gender: male  History of Present Illness:  Michael Holder is a 65 y.o. very pleasant male patient who presents with the following:  All hospital records reviewed.  Patient admitted with abdominal discomfort, vomitting with coffee ground type emesis, and ultimately found to have adenocarcinoma of the colon with T3N0M0 staging. Right now he is doing well, staples have been removed.   Has upcoming appts with General surgery and medical oncology for follow-up. He feels really well.  Having good BM's now. Passing gas and feeling less bloated. No pain.  BS under good control.  Past Medical History, Surgical History, Social History, Family History, and Problem List have been reviewed in EHR and updated if relevant.  Review of Systems: As above, no fever, chills, sweats, no abdominal pain.  Physical Examination: Filed Vitals:   07/09/11 1431  BP: 120/70  Pulse: 80  Temp: 98.7 F (37.1 C)  TempSrc: Oral  Height: 5\' 10"  (1.778 m)  Weight: 210 lb 12.8 oz (95.618 kg)  SpO2: 99%    Body mass index is 30.25 kg/(m^2).  GEN: WDWN, NAD, Non-toxic, A & O x 3 HEENT: Atraumatic, Normocephalic. Neck supple. No masses, No LAD. Ears and Nose: No external deformity. CV: RRR, No M/G/R. No JVD. No thrill. No extra heart sounds. PULM: CTA B, no wheezes, crackles, rhonchi. No retractions. No resp. distress. No accessory muscle use. ABD: S, NT, ND, +BS. No rebound. No HSM. Incision c/d/i and healing well. EXTR: No c/c/e NEURO Normal gait.  PSYCH: Normally interactive. Conversant. Not depressed or anxious appearing.  Calm demeanor.    Assessment and Plan: 1. Adenocarcinoma, colon     DM stable BP stable  Doing well from a medical standpoint - will keep f/u about cancer with surg and onc and recheck for physical with me in 3-4 months

## 2011-07-09 NOTE — Progress Notes (Signed)
Removed 26 staples from patients midline abdomen, applied steri strips and expressed to patient if he has any dehiscence to contact the office, pt is to keep the area clean and dry. Instructions understood by the patient and his wife. Patient to follow up with Dr Janee Morn for post op.

## 2011-07-10 ENCOUNTER — Encounter: Payer: Self-pay | Admitting: Family Medicine

## 2011-07-10 DIAGNOSIS — C189 Malignant neoplasm of colon, unspecified: Secondary | ICD-10-CM | POA: Insufficient documentation

## 2011-07-16 ENCOUNTER — Encounter (INDEPENDENT_AMBULATORY_CARE_PROVIDER_SITE_OTHER): Payer: Self-pay | Admitting: General Surgery

## 2011-07-16 ENCOUNTER — Ambulatory Visit (INDEPENDENT_AMBULATORY_CARE_PROVIDER_SITE_OTHER): Payer: Medicare Other | Admitting: General Surgery

## 2011-07-16 DIAGNOSIS — C189 Malignant neoplasm of colon, unspecified: Secondary | ICD-10-CM

## 2011-07-16 NOTE — Progress Notes (Signed)
Subjective:     Patient ID: Michael Holder, male   DOB: 07/10/1946, 65 y.o.   MRN: [id number]  HPI  Patient status post right colectomy for colon cancer. He is eating and moving his bowels. He occasionally needs milk of magnesia. He is otherwise feeling well.Review of Systems     Objective:   Physical Exam Abdomen soft and nontender incision is well-healed    Assessment:     Doing well status post right colectomy for cancer    Plan:     We will arrange medical oncology followup. I will see him back in 2 months.

## 2011-07-30 ENCOUNTER — Telehealth: Payer: Self-pay | Admitting: Oncology

## 2011-07-30 NOTE — Telephone Encounter (Signed)
S/w the pt and he is aware of his new pt appt on 08/12/2011@2 :00pm

## 2011-07-31 ENCOUNTER — Telehealth: Payer: Self-pay | Admitting: Oncology

## 2011-07-31 NOTE — Telephone Encounter (Signed)
Referred by Dr. Charlott Rakes Dx- Colon Ca

## 2011-08-08 ENCOUNTER — Ambulatory Visit (INDEPENDENT_AMBULATORY_CARE_PROVIDER_SITE_OTHER)
Admission: RE | Admit: 2011-08-08 | Discharge: 2011-08-08 | Disposition: A | Discharge status: Home / Self Care | Payer: Medicare Other | Source: Ambulatory Visit | Attending: Family Medicine | Admitting: Family Medicine

## 2011-08-08 ENCOUNTER — Telehealth: Payer: Self-pay | Admitting: *Deleted

## 2011-08-08 ENCOUNTER — Encounter: Payer: Self-pay | Admitting: Family Medicine

## 2011-08-08 ENCOUNTER — Ambulatory Visit (INDEPENDENT_AMBULATORY_CARE_PROVIDER_SITE_OTHER): Payer: Medicare Other | Admitting: Family Medicine

## 2011-08-08 VITALS — BP 124/72 | HR 72 | Temp 97.7°F | Wt 214.2 lb

## 2011-08-08 DIAGNOSIS — R0781 Pleurodynia: Secondary | ICD-10-CM

## 2011-08-08 DIAGNOSIS — W19XXXA Unspecified fall, initial encounter: Secondary | ICD-10-CM

## 2011-08-08 DIAGNOSIS — S199XXA Unspecified injury of neck, initial encounter: Secondary | ICD-10-CM

## 2011-08-08 DIAGNOSIS — S0992XA Unspecified injury of nose, initial encounter: Secondary | ICD-10-CM

## 2011-08-08 DIAGNOSIS — S0993XA Unspecified injury of face, initial encounter: Secondary | ICD-10-CM

## 2011-08-08 DIAGNOSIS — R079 Chest pain, unspecified: Secondary | ICD-10-CM

## 2011-08-08 DIAGNOSIS — R0789 Other chest pain: Secondary | ICD-10-CM
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
## 2011-08-08 NOTE — Telephone Encounter (Signed)
Pt's wife calling that pt fell this morning hit his nose, bleeding has stopped and per pt he doesn't think it's broke. I advised to apply ice and also that we don't do x-rays to the face per Terri. Pt and wife advised understanding. No laceration or open wound per wife and pt, just wanted to be seen to be sure everything is ok.   ( Dr.Letvak didn't have any more openings)appt scheduled at 1:15pm with Dr. Dayton Martes.

## 2011-08-12 ENCOUNTER — Ambulatory Visit: Payer: Medicare Other

## 2011-08-12 ENCOUNTER — Other Ambulatory Visit: Payer: Medicare Other | Admitting: Lab

## 2011-08-12 ENCOUNTER — Telehealth: Payer: Self-pay | Admitting: Oncology

## 2011-08-12 ENCOUNTER — Ambulatory Visit (HOSPITAL_BASED_OUTPATIENT_CLINIC_OR_DEPARTMENT_OTHER): Payer: Medicare Other | Admitting: Oncology

## 2011-08-12 VITALS — BP 138/84 | HR 72 | Temp 98.1°F | Ht 68.5 in | Wt 213.1 lb

## 2011-08-12 DIAGNOSIS — I2510 Atherosclerotic heart disease of native coronary artery without angina pectoris: Secondary | ICD-10-CM

## 2011-08-12 DIAGNOSIS — E119 Type 2 diabetes mellitus without complications: Secondary | ICD-10-CM

## 2011-08-12 DIAGNOSIS — C189 Malignant neoplasm of colon, unspecified: Secondary | ICD-10-CM

## 2011-08-12 DIAGNOSIS — Z859 Personal history of malignant neoplasm, unspecified: Secondary | ICD-10-CM

## 2011-08-12 NOTE — Telephone Encounter (Signed)
02/09/12 appt made and pt to see dr newman/ent on 1/24 at 1:00,prionted for pt  aom
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
## 2011-09-02 ENCOUNTER — Ambulatory Visit: Payer: Medicare Other | Admitting: Family Medicine

## 2011-09-02 ENCOUNTER — Other Ambulatory Visit: Payer: Self-pay | Admitting: *Deleted

## 2011-09-02 MED ORDER — TAMSULOSIN HCL 0.4 MG PO CAPS: 0.4000 mg | ORAL_CAPSULE | Freq: Every day | ORAL | Status: DC

## 2011-09-02 MED ORDER — LISINOPRIL 40 MG PO TABS: 40.0000 mg | ORAL_TABLET | Freq: Every day | ORAL | Status: DC

## 2011-09-02 MED ORDER — METFORMIN HCL 500 MG PO TABS: 500.0000 mg | ORAL_TABLET | Freq: Every day | ORAL | Status: DC

## 2011-09-02 MED ORDER — SIMVASTATIN 40 MG PO TABS: 40.0000 mg | ORAL_TABLET | Freq: Every day | ORAL | Status: DC

## 2011-09-03 ENCOUNTER — Ambulatory Visit: Payer: Medicare Other | Admitting: Family Medicine

## 2011-09-10 ENCOUNTER — Ambulatory Visit (INDEPENDENT_AMBULATORY_CARE_PROVIDER_SITE_OTHER): Payer: Medicare Other | Admitting: General Surgery

## 2011-09-10 ENCOUNTER — Encounter (INDEPENDENT_AMBULATORY_CARE_PROVIDER_SITE_OTHER): Payer: Self-pay | Admitting: General Surgery

## 2011-09-10 VITALS — BP 150/84 | HR 71 | Temp 98.4°F | Ht 70.0 in | Wt 215.2 lb

## 2011-09-10 DIAGNOSIS — C189 Malignant neoplasm of colon, unspecified: Secondary | ICD-10-CM

## 2011-09-10 NOTE — Progress Notes (Signed)
Subjective:     Patient ID: Michael Holder, male   DOB: [birth year], 66 y.o.   MRN: [id number]  HPI Patient presents for followup of colon cancer. He is status post right colectomy. He is being followed by Dr.Sherrill at the cancer center. No chemotherapy is planned at this time. He's eating well and moving his bowels easily.  Review of Systems     Objective:   Physical Exam  Cardiovascular: Normal rate and normal heart sounds.   Pulmonary/Chest: Effort normal and breath sounds normal.   Abdomen is soft and nontenderBowel sounds are normal and his midline incision is well-healed. There is no masses.    Assessment:     Doing well after colectomy for colon cancer    Plan:     Continue to follow up with oncology. Return here p.r.n.

## 2011-09-12 ENCOUNTER — Other Ambulatory Visit: Payer: Self-pay | Admitting: *Deleted

## 2011-09-15 ENCOUNTER — Other Ambulatory Visit: Payer: Self-pay | Admitting: *Deleted

## 2011-09-15 MED ORDER — LISINOPRIL 40 MG PO TABS: 40.0000 mg | ORAL_TABLET | Freq: Every day | ORAL | Status: DC

## 2011-09-15 MED ORDER — SIMVASTATIN 40 MG PO TABS: 40.0000 mg | ORAL_TABLET | Freq: Every day | ORAL | Status: DC

## 2011-09-15 MED ORDER — TAMSULOSIN HCL 0.4 MG PO CAPS: 0.4000 mg | ORAL_CAPSULE | Freq: Every day | ORAL | Status: DC

## 2011-09-15 MED ORDER — METFORMIN HCL 500 MG PO TABS: 500.0000 mg | ORAL_TABLET | Freq: Every day | ORAL | Status: DC

## 2011-10-01 ENCOUNTER — Encounter: Payer: Self-pay | Admitting: *Deleted

## 2011-10-01 ENCOUNTER — Encounter: Payer: Self-pay | Admitting: Family Medicine

## 2011-10-01 ENCOUNTER — Ambulatory Visit: Payer: Medicare Other | Admitting: Family Medicine

## 2011-10-01 ENCOUNTER — Ambulatory Visit (INDEPENDENT_AMBULATORY_CARE_PROVIDER_SITE_OTHER): Payer: Medicare Other | Admitting: Family Medicine

## 2011-10-01 VITALS — BP 142/84 | HR 64 | Temp 97.8°F | Wt 223.8 lb

## 2011-10-01 DIAGNOSIS — E1140 Type 2 diabetes mellitus with diabetic neuropathy, unspecified: Secondary | ICD-10-CM | POA: Insufficient documentation

## 2011-10-01 DIAGNOSIS — C760 Malignant neoplasm of head, face and neck: Secondary | ICD-10-CM

## 2011-10-01 DIAGNOSIS — E1149 Type 2 diabetes mellitus with other diabetic neurological complication: Secondary | ICD-10-CM

## 2011-10-01 DIAGNOSIS — I2510 Atherosclerotic heart disease of native coronary artery without angina pectoris: Secondary | ICD-10-CM

## 2011-10-01 DIAGNOSIS — IMO0002 Reserved for concepts with insufficient information to code with codable children: Secondary | ICD-10-CM | POA: Insufficient documentation

## 2011-10-01 DIAGNOSIS — I10 Essential (primary) hypertension: Secondary | ICD-10-CM

## 2011-10-01 DIAGNOSIS — I2119 ST elevation (STEMI) myocardial infarction involving other coronary artery of inferior wall: Secondary | ICD-10-CM

## 2011-10-01 DIAGNOSIS — K5790 Diverticulosis of intestine, part unspecified, without perforation or abscess without bleeding: Secondary | ICD-10-CM | POA: Insufficient documentation

## 2011-10-01 DIAGNOSIS — E119 Type 2 diabetes mellitus without complications: Secondary | ICD-10-CM

## 2011-10-01 DIAGNOSIS — K5730 Diverticulosis of large intestine without perforation or abscess without bleeding: Secondary | ICD-10-CM

## 2011-10-01 DIAGNOSIS — K259 Gastric ulcer, unspecified as acute or chronic, without hemorrhage or perforation: Secondary | ICD-10-CM | POA: Insufficient documentation

## 2011-10-01 DIAGNOSIS — E785 Hyperlipidemia, unspecified: Secondary | ICD-10-CM

## 2011-10-01 HISTORY — DX: Type 2 diabetes mellitus with other diabetic neurological complication: E11.49

## 2011-10-01 LAB — HEMOGLOBIN A1C: Hgb A1c MFr Bld: 6.1 % (ref 4.6–6.5)

## 2011-10-01 LAB — LIPID PANEL: Total CHOL/HDL Ratio: 3
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
## 2011-10-01 NOTE — Patient Instructions (Signed)
F/u 6 months

## 2011-12-10 ENCOUNTER — Encounter: Payer: Self-pay | Admitting: Neurosurgery

## 2011-12-11 ENCOUNTER — Encounter: Payer: Self-pay | Admitting: Neurosurgery

## 2011-12-11 ENCOUNTER — Ambulatory Visit (INDEPENDENT_AMBULATORY_CARE_PROVIDER_SITE_OTHER): Payer: Medicare Other | Admitting: Neurosurgery

## 2011-12-11 ENCOUNTER — Ambulatory Visit (INDEPENDENT_AMBULATORY_CARE_PROVIDER_SITE_OTHER): Payer: Medicare Other | Admitting: *Deleted

## 2011-12-11 VITALS — BP 185/89 | HR 64 | Resp 14 | Ht 70.0 in | Wt 222.3 lb

## 2011-12-11 DIAGNOSIS — I6529 Occlusion and stenosis of unspecified carotid artery: Secondary | ICD-10-CM

## 2011-12-11 DIAGNOSIS — Z48812 Encounter for surgical aftercare following surgery on the circulatory system: Secondary | ICD-10-CM
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
## 2011-12-12 NOTE — Progress Notes (Signed)
Addended by: Sharee Pimple on: 12/12/2011 08:06 AM   Modules accepted: Orders

## 2011-12-17 NOTE — Procedures (Unsigned)
CAROTID DUPLEX EXAM  INDICATION:  Carotid endarterectomy.  HISTORY: Diabetes:  Yes. Cardiac:  MI, PTCA. Hypertension:  Yes. Smoking:  Previous. Previous Surgery:  Right carotid endarterectomy, 02/28/2003, by Dr. Madilyn Fireman. CV History:  Asymptomatic. Amaurosis Fugax No, Paresthesias No, Hemiparesis No.                                      RIGHT             LEFT Brachial systolic pressure:         154               160 Brachial Doppler waveforms:         Triphasic         Triphasic Vertebral direction of flow:        Antegrade         Antegrade DUPLEX VELOCITIES (cm/sec) CCA peak systolic                   158 (proximal)    102 (proximal) ECA peak systolic                   77                113 ICA peak systolic                   72                118 (curve) ICA end diastolic                   21                39 PLAQUE MORPHOLOGY:                  Mixed             Calcific PLAQUE AMOUNT:                      Mild              Mild PLAQUE LOCATION:                    Bifurcation/ICA   CCA, ICA, ECA  IMPRESSION: 1. A 1% to 39% bilateral internal carotid artery stenosis. 2. No significant change since the prior study 12/11/2010.     ___________________________________________ Di Kindle. Edilia Bo, M.D.  SS/MEDQ  D:  12/11/2011  T:  12/11/2011  Job:  [id number]

## 2012-02-09 ENCOUNTER — Other Ambulatory Visit (HOSPITAL_BASED_OUTPATIENT_CLINIC_OR_DEPARTMENT_OTHER): Payer: Medicare Other | Admitting: Lab

## 2012-02-09 ENCOUNTER — Ambulatory Visit (HOSPITAL_BASED_OUTPATIENT_CLINIC_OR_DEPARTMENT_OTHER): Payer: Medicare Other | Admitting: Oncology

## 2012-02-09 ENCOUNTER — Telehealth: Payer: Self-pay | Admitting: *Deleted

## 2012-02-09 VITALS — BP 175/93 | HR 66 | Temp 97.5°F | Ht 70.0 in | Wt 229.2 lb

## 2012-02-09 DIAGNOSIS — C189 Malignant neoplasm of colon, unspecified: Secondary | ICD-10-CM

## 2012-02-09 NOTE — Progress Notes (Signed)
   [facility]    OFFICE PROGRESS NOTE   INTERVAL HISTORY:   He returns as scheduled. No difficulty with bowel function. No new complaint.  Objective:  Vital signs in last 24 hours:  Blood pressure 175/93, pulse 66, temperature 97.5 F (36.4 C), temperature source Oral, height 5\' 10"  (1.778 m), weight 229 lb 3.2 oz (103.964 kg).    HEENT: Neck without mass, postradiation changes at the bilateral neck, the mouth is dry, oropharynx without visible mass Lymphatics: No cervical, clavicular, axillary, or inguinal nodes Resp: Lungs clear bilaterally Cardio: Regular rate and rhythm GI: No hepatomegaly, no mass Vascular: No leg edema  Labs:  CEA pending   Medications: I have reviewed the patient's current medications.  Assessment/Plan: 1. Stage II (T3 N0) well differentiated adenocarcinoma of the a sending colon, status post a right colectomy 06/25/2011 2. Remote history of head and neck cancer  3. History of coronary artery disease  4. Diabetes  5. Episode of hemoptysis prior to hospital admission in December 2012.    Disposition:  He remains in clinical remission from colon cancer. We will followup on the CEA from today. He will return for an office visit and CEA in 6 months. He will see Dr. Madilyn Fireman for a one-year surveillance colonoscopy.  Thornton Papas, MD  02/09/2012  4:28 PM

## 2012-02-09 NOTE — Telephone Encounter (Signed)
Gave patient appointment for 08-10-2012 starting at 8:30am with labs printed out calendar and gave to the patient

## 2012-02-11 ENCOUNTER — Telehealth: Payer: Self-pay | Admitting: *Deleted

## 2012-02-11 NOTE — Telephone Encounter (Signed)
Called pt with lab results. He voiced understanding.

## 2012-02-11 NOTE — Telephone Encounter (Signed)
Message copied by Caleb Popp on Wed Feb 11, 2012 11:08 AM ------      Message from: Ladene Artist      Created: Tue Feb 10, 2012  9:56 PM       Please call patient, cea is normal

## 2012-02-12 ENCOUNTER — Ambulatory Visit (INDEPENDENT_AMBULATORY_CARE_PROVIDER_SITE_OTHER): Payer: Medicare Other | Admitting: Family Medicine

## 2012-02-12 ENCOUNTER — Encounter: Payer: Self-pay | Admitting: Family Medicine

## 2012-02-12 VITALS — BP 118/68 | HR 65 | Temp 97.9°F | Ht 70.0 in | Wt 214.8 lb

## 2012-02-12 DIAGNOSIS — R109 Unspecified abdominal pain: Secondary | ICD-10-CM

## 2012-02-12 NOTE — Progress Notes (Signed)
Nature conservation officer at [facility] [address] [place] [address] Phone: [phone] Fax: [phone]  Date:  02/12/2012   Name:  Michael Holder   DOB:  1946-03-31   MRN:  [id number]  PCP:  Hannah Beat, MD    Chief Complaint: Abdominal Cramping   History of Present Illness:  Michael Holder is a 66 y.o. very pleasant male patient who presents with the following:  Woke up this morning and a lot of gas this morning. Anywhere on stomache. No where like before.   2-3 days ago last BM Flatus today.  Pleasant gentleman who I know well who has a history of adenocarcinoma of the colon, status post partial colectomy earlier in the year, who has had some abdominal pain starting this morning. He was quite significant when he 1st woke up, but at the time of his office visit his symptoms have become very minimal. He has been able to eat and drink, but diminished somewhat. His last bowel movement was several days ago. He is passing gas.  He's not had any blood in his stool. He does occasionally have some watery stool, but this is been present ever since his prior  Partial colectomy.  Past Medical History, Surgical History, Social History, Family History, Problem List, Medications, and Allergies have been reviewed and updated if relevant.  Current Outpatient Prescriptions on File Prior to Visit  Medication Sig Dispense Refill  . aspirin 81 MG EC tablet Take 81 mg by mouth daily.        . fish oil-omega-3 fatty acids 1000 MG capsule Take 2 g by mouth daily. Omega red fish oil      . lisinopril (PRINIVIL,ZESTRIL) 40 MG tablet Take 1 tablet (40 mg total) by mouth daily.  90 tablet  3  . metFORMIN (GLUCOPHAGE) 500 MG tablet Take 1 tablet (500 mg total) by mouth daily.  90 tablet  3  . Multiple Vitamin (MULTIVITAMIN) tablet Take 1 tablet by mouth daily.        . simvastatin (ZOCOR) 40 MG tablet Take 1 tablet (40 mg total) by mouth at bedtime.  90 tablet  3  . Tamsulosin HCl (FLOMAX)  0.4 MG CAPS Take 1 capsule (0.4 mg total) by mouth daily after breakfast.  90 capsule  3    Review of Systems: Otherwise, he has been feeling well. No fevers, chills or sweats. No chest pain. No shortness of breath.  Physical Examination: Filed Vitals:   02/12/12 1507  BP: 118/68  Pulse: 65  Temp: 97.9 F (36.6 C)   Filed Vitals:   02/12/12 1507  Height: 5\' 10"  (1.778 m)  Weight: 214 lb 12 oz (97.41 kg)   Body mass index is 30.81 kg/(m^2). Ideal Body Weight: Weight in (lb) to have BMI = 25: 173.9   GEN: WDWN, NAD, Non-toxic, A & O x 3 HEENT: Atraumatic, Normocephalic. Neck supple. No masses, No LAD. Ears and Nose: No external deformity. CV: RRR, No M/G/R. No JVD. No thrill. No extra heart sounds. PULM: CTA B, no wheezes, crackles, rhonchi. No retractions. No resp. distress. No accessory muscle use. ABD: S, minimally tender nonspecifically, ND, +BS. No rebound. No HSM. EXTR: No c/c/e NEURO Normal gait.  PSYCH: Normally interactive. Conversant. Not depressed or anxious appearing.  Calm demeanor.    Assessment and Plan:  1. Abdominal  pain, other specified site    The patient is dramatically better compared to the morning when he woke up. Think is very reasonable  to follow this and a compliant patient, known well. I advised him to take some milk of magnesia, and see how he does over the next day or so. If he just continues to have some symptoms, we'll need to follow it up  Orders Today:  No orders of the defined types were placed in this encounter.    Medications Today: (Includes new updates added during medication reconciliation) No orders of the defined types were placed in this encounter.     Hannah Beat, MD

## 2012-03-29 ENCOUNTER — Encounter: Payer: Self-pay | Admitting: *Deleted

## 2012-03-29 ENCOUNTER — Ambulatory Visit (INDEPENDENT_AMBULATORY_CARE_PROVIDER_SITE_OTHER): Payer: Medicare Other | Admitting: Family Medicine

## 2012-03-29 ENCOUNTER — Encounter: Payer: Self-pay | Admitting: Family Medicine

## 2012-03-29 VITALS — BP 120/78 | HR 67 | Temp 97.9°F | Wt 231.8 lb

## 2012-03-29 DIAGNOSIS — E785 Hyperlipidemia, unspecified: Secondary | ICD-10-CM

## 2012-03-29 DIAGNOSIS — C189 Malignant neoplasm of colon, unspecified: Secondary | ICD-10-CM

## 2012-03-29 DIAGNOSIS — E1142 Type 2 diabetes mellitus with diabetic polyneuropathy: Secondary | ICD-10-CM

## 2012-03-29 DIAGNOSIS — Z79899 Other long term (current) drug therapy: Secondary | ICD-10-CM

## 2012-03-29 DIAGNOSIS — E1149 Type 2 diabetes mellitus with other diabetic neurological complication: Secondary | ICD-10-CM

## 2012-03-29 DIAGNOSIS — E1140 Type 2 diabetes mellitus with diabetic neuropathy, unspecified: Secondary | ICD-10-CM

## 2012-03-29 DIAGNOSIS — I2510 Atherosclerotic heart disease of native coronary artery without angina pectoris: Secondary | ICD-10-CM

## 2012-03-29 DIAGNOSIS — I10 Essential (primary) hypertension: Secondary | ICD-10-CM

## 2012-03-29 DIAGNOSIS — E119 Type 2 diabetes mellitus without complications: Secondary | ICD-10-CM

## 2012-03-29 LAB — CBC WITH DIFFERENTIAL/PLATELET
Basophils Absolute: 0.1 10*3/uL (ref 0.0–0.1)
HCT: 40.3 % (ref 39.0–52.0)
Hemoglobin: 13.3 g/dL (ref 13.0–17.0)
Lymphs Abs: 1.8 10*3/uL (ref 0.7–4.0)
MCV: 93 fl (ref 78.0–100.0)
Monocytes Absolute: 0.6 10*3/uL (ref 0.1–1.0)
Neutro Abs: 3.7 10*3/uL (ref 1.4–7.7)
Platelets: 149 10*3/uL — ABNORMAL LOW (ref 150.0–400.0)
RDW: 12.6 % (ref 11.5–14.6)

## 2012-03-29 LAB — HEPATIC FUNCTION PANEL
ALT: 14 U/L (ref 0–53)
AST: 14 U/L (ref 0–37)
Albumin: 3.9 g/dL (ref 3.5–5.2)
Total Bilirubin: 0.4 mg/dL (ref 0.3–1.2)

## 2012-03-29 LAB — BASIC METABOLIC PANEL
BUN: 21 mg/dL (ref 6–23)
CO2: 29 mEq/L (ref 19–32)
Chloride: 99 mEq/L (ref 96–112)
Glucose, Bld: 129 mg/dL — ABNORMAL HIGH (ref 70–99)
Potassium: 4.5 mEq/L (ref 3.5–5.1)
Sodium: 136 mEq/L (ref 135–145)
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
## 2012-07-23 ENCOUNTER — Telehealth: Payer: Self-pay | Admitting: *Deleted

## 2012-07-26 MED ORDER — SIMVASTATIN 40 MG PO TABS: 40.0000 mg | ORAL_TABLET | Freq: Every day | ORAL | Status: DC

## 2012-07-26 MED ORDER — LISINOPRIL 40 MG PO TABS: 40.0000 mg | ORAL_TABLET | Freq: Every day | ORAL | Status: DC

## 2012-07-26 MED ORDER — METFORMIN HCL 500 MG PO TABS: 500.0000 mg | ORAL_TABLET | Freq: Every day | ORAL | Status: DC

## 2012-07-26 MED ORDER — TAMSULOSIN HCL 0.4 MG PO CAPS: 0.4000 mg | ORAL_CAPSULE | Freq: Every day | ORAL | Status: DC

## 2012-07-26 NOTE — Telephone Encounter (Signed)
rx faxed to number below  [phone]

## 2012-07-26 NOTE — Telephone Encounter (Signed)
Please sign they need to be faxed

## 2012-08-04 ENCOUNTER — Other Ambulatory Visit: Payer: Self-pay | Admitting: *Deleted

## 2012-08-04 MED ORDER — METFORMIN HCL 500 MG PO TABS: 500.0000 mg | ORAL_TABLET | Freq: Every day | ORAL | Status: DC

## 2012-08-04 MED ORDER — SIMVASTATIN 40 MG PO TABS: 40.0000 mg | ORAL_TABLET | Freq: Every day | ORAL | Status: DC

## 2012-08-04 MED ORDER — LISINOPRIL 40 MG PO TABS: 40.0000 mg | ORAL_TABLET | Freq: Every day | ORAL | Status: DC

## 2012-08-04 MED ORDER — TAMSULOSIN HCL 0.4 MG PO CAPS: 0.4000 mg | ORAL_CAPSULE | Freq: Every day | ORAL | Status: DC

## 2012-08-09 ENCOUNTER — Other Ambulatory Visit: Payer: Self-pay

## 2012-08-09 ENCOUNTER — Telehealth: Payer: Self-pay | Admitting: Family Medicine

## 2012-08-09 NOTE — Telephone Encounter (Signed)
Pt left note needs 4 [facility]s [facility]; pt just had 4 meds filled 08/04/12 [facility]. Left v/m for pt to call back.

## 2012-08-09 NOTE — Telephone Encounter (Signed)
Pt called back during after hours regarding message he missed earlier.  Pt states that [facility] is "having trouble filling the prescription written on 08/04/12 and they need to speak with Dr Patsy Lager.  RN just wanted to make sure office knew the reason pt was requesting the medications.  OFFICE please follow up with medications for pt

## 2012-08-10 ENCOUNTER — Telehealth: Payer: Self-pay | Admitting: Family Medicine

## 2012-08-10 ENCOUNTER — Telehealth: Payer: Self-pay | Admitting: Oncology

## 2012-08-10 ENCOUNTER — Other Ambulatory Visit (HOSPITAL_BASED_OUTPATIENT_CLINIC_OR_DEPARTMENT_OTHER): Payer: Medicare Other | Admitting: Lab

## 2012-08-10 ENCOUNTER — Ambulatory Visit (HOSPITAL_BASED_OUTPATIENT_CLINIC_OR_DEPARTMENT_OTHER): Payer: Medicare Other | Admitting: Oncology

## 2012-08-10 VITALS — BP 156/90 | HR 71 | Temp 97.8°F | Resp 20 | Ht 70.0 in | Wt 230.7 lb

## 2012-08-10 DIAGNOSIS — E119 Type 2 diabetes mellitus without complications: Secondary | ICD-10-CM

## 2012-08-10 DIAGNOSIS — C189 Malignant neoplasm of colon, unspecified: Secondary | ICD-10-CM

## 2012-08-10 DIAGNOSIS — C182 Malignant neoplasm of ascending colon: Secondary | ICD-10-CM

## 2012-08-10 LAB — CEA: CEA: 2.2 ng/mL (ref 0.0–5.0)

## 2012-08-10 NOTE — Telephone Encounter (Signed)
Can you call Mr. Gencarelli - it looks like we refilled 4 meds on 08/04/2012. Can you make sure those are the 4 that he needs  thanks

## 2012-08-10 NOTE — Telephone Encounter (Signed)
Patient says there was some confusion and he just needs to call [facility] to get this worked out

## 2012-08-10 NOTE — Telephone Encounter (Signed)
Please see answer in 08/09/12 CAN note.

## 2012-08-10 NOTE — Telephone Encounter (Signed)
noted 

## 2012-08-10 NOTE — Telephone Encounter (Signed)
appts made and printed for pt aom °

## 2012-08-10 NOTE — Progress Notes (Signed)
   [facility]    OFFICE PROGRESS NOTE   INTERVAL HISTORY:   He returns as scheduled. He feels well. Occasional liquid stool. Good appetite. No dyspnea. He is seeing Dr. Channing Mutters for treatment of an ulcer at the right lower leg. He underwent a colonoscopy by Dr. Madilyn Fireman on 06/22/2012. No evidence for recurrent cancer.  Objective:  Vital signs in last 24 hours:  Blood pressure 156/90, pulse 71, temperature 97.8 F (36.6 C), temperature source Oral, resp. rate 20, height 5\' 10"  (1.778 m), weight 230 lb 11.2 oz (104.645 kg).    HEENT: Neck without mass, postsurgical and radiation change at the bilateral neck. Oropharynx without visible mass. The mouth is dry. Lymphatics: No cervical, supraclavicular, or inguinal nodes.? 1/2 cm left axillary node. Resp: Lungs clear bilaterally Cardio: Regular rate and rhythm GI: No hepatomegaly, no mass Vascular: No leg edema   Lab Results:  CEA pending   Medications: I have reviewed the patient's current medications.  Assessment/Plan: 1.Stage II (T3 N0) well differentiated adenocarcinoma of the a sending colon, status post a right colectomy 06/25/2011 , negative surveillance colonoscopy 06/22/2012 2. Remote history of head and neck cancer  3. History of coronary artery disease  4. Diabetes  5. Episode of hemoptysis prior to hospital admission in December 2012.   Disposition:  He remains in clinical remission from colon cancer. He will return for an office visit and CEA in 6 months.   Thornton Papas, MD  08/10/2012  9:30 AM

## 2012-08-10 NOTE — Telephone Encounter (Signed)
[facility] Triage Call Report Triage Record Num: [id number] Operator: Roselyn Meier Patient Name: Michael Holder Call Date & Time: 08/09/2012 5:39:00PM Patient Phone: [phone] PCP: Hannah Beat Patient Gender: Male PCP Fax : [phone] Patient DOB: [birth year] Practice Name: [facility] Reason for Call: Caller: Steve/Patient; PCP: Hannah Beat (Family Practice); CB#: [phone]; Call regarding Returning a call; Pt states he missed a call from the office. RN found the message from office staff from earlier. Pt is requesting a refill of medications (pt states the RX dated on 08/04/12 has not been filled by [facility]; they are having trouble with them and need to speak to office. RN left message in EMR for pt. Protocol(s) Used: Office Note Recommended Outcome per Protocol: Information Noted and Sent to Office Reason for Outcome: Caller information to office Care Advice: ~ 08/09/2012 5:47:05PM Page 1 of 1 [email or web address]

## 2012-08-10 NOTE — Telephone Encounter (Signed)
[facility];spoke with Carley Hammed and was advised that the message left for pt was for pt to call [facility] to verify pt would give permission to fill the order sent by Dr Patsy Lager. Pt voiced understanding.

## 2012-08-10 NOTE — Telephone Encounter (Signed)
See 08/09/12 note from CAN for answer.

## 2012-08-11 ENCOUNTER — Telehealth: Payer: Self-pay | Admitting: *Deleted

## 2012-08-11 NOTE — Telephone Encounter (Signed)
Message copied by Wandalee Ferdinand on Wed Aug 11, 2012 12:11 PM ------      Message from: Ladene Artist      Created: Tue Aug 10, 2012  8:55 PM       Please call patient, Michael Holder is normal

## 2012-08-11 NOTE — Telephone Encounter (Signed)
Notified of normal CEA. 

## 2012-09-29 ENCOUNTER — Ambulatory Visit: Payer: Medicare Other | Admitting: Family Medicine

## 2012-10-04 ENCOUNTER — Encounter: Payer: Self-pay | Admitting: Family Medicine

## 2012-10-04 ENCOUNTER — Ambulatory Visit (INDEPENDENT_AMBULATORY_CARE_PROVIDER_SITE_OTHER): Payer: Medicare Other | Admitting: Family Medicine

## 2012-10-04 VITALS — BP 140/84 | HR 68 | Temp 98.2°F | Ht 70.0 in | Wt 231.0 lb

## 2012-10-04 DIAGNOSIS — E119 Type 2 diabetes mellitus without complications: Secondary | ICD-10-CM

## 2012-10-04 DIAGNOSIS — E785 Hyperlipidemia, unspecified: Secondary | ICD-10-CM

## 2012-10-04 DIAGNOSIS — I10 Essential (primary) hypertension: Secondary | ICD-10-CM

## 2012-10-04 DIAGNOSIS — E890 Postprocedural hypothyroidism: Secondary | ICD-10-CM

## 2012-10-04 DIAGNOSIS — I2510 Atherosclerotic heart disease of native coronary artery without angina pectoris: Secondary | ICD-10-CM

## 2012-10-04 LAB — MICROALBUMIN / CREATININE URINE RATIO
Creatinine,U: 84.2 mg/dL
Microalb Creat Ratio: 2.5 mg/g (ref 0.0–30.0)
Microalb, Ur: 2.1 mg/dL — ABNORMAL HIGH (ref 0.0–1.9)

## 2012-10-04 LAB — BASIC METABOLIC PANEL
CO2: 30 mEq/L (ref 19–32)
Calcium: 9.9 mg/dL (ref 8.4–10.5)
Creatinine, Ser: 1.3 mg/dL (ref 0.4–1.5)
GFR: 56.42 mL/min — ABNORMAL LOW (ref >60.00)
Sodium: 133 mEq/L — ABNORMAL LOW (ref 135–145)
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
## 2012-10-05 ENCOUNTER — Encounter: Payer: Self-pay | Admitting: *Deleted

## 2012-10-29 ENCOUNTER — Encounter: Payer: Self-pay | Admitting: Family Medicine

## 2012-10-29 ENCOUNTER — Ambulatory Visit (INDEPENDENT_AMBULATORY_CARE_PROVIDER_SITE_OTHER): Payer: Medicare Other | Admitting: Family Medicine

## 2012-10-29 VITALS — BP 148/86 | HR 67 | Temp 98.0°F | Wt 231.0 lb

## 2012-10-29 DIAGNOSIS — T148XXA Other injury of unspecified body region, initial encounter: Secondary | ICD-10-CM

## 2012-10-29 DIAGNOSIS — IMO0002 Reserved for concepts with insufficient information to code with codable children: Secondary | ICD-10-CM

## 2012-10-29 NOTE — Assessment & Plan Note (Addendum)
Td UTD. Forehead wound without significant depth, so after cleaning I decided to close wound with dermabond. Dermabond also used on left elbow wound. Red flags to return discussed.

## 2012-10-29 NOTE — Patient Instructions (Addendum)
Keep area clean and dry. No showering for 24 hours. When you start showering, pat dry. Watch for spreading redness, continued bleed, or worsening pain. Let us know if not improving as expected.  Tissue Adhesive Wound Care A wound can be repaired by using tissue adhesive. Tissue adhesive holds the skin together and allows faster healing. It forms a strong bond on the skin in about 1 minute and reaches its full strength in about 2 or 3 minutes. The adhesive disappears naturally while healing. Follow up is required if your caregiver wants to recheck for infection and to make sure your wound is healing properly.  You may need a tetanus shot if:  You cannot remember when you had your last tetanus shot.  You have never had a tetanus shot.  The injury broke your skin. If you got a tetanus shot, your arm may swell, get red, and feel warm to the touch. This is common and not a problem. If you need a tetanus shot and you choose not to have one, there is a rare chance of getting tetanus. Sickness from tetanus can be serious. HOME CARE INSTRUCTIONS   Only take over-the-counter or prescription medicines for pain, discomfort, or fever as directed by your caregiver.  Showers are allowed. Do not soak the area containing the tissue adhesive. Do not take baths, swim, or use hot tubs. Do not use any soaps or ointments on the wound until it has healed.  If a bandage (dressing) has been applied, follow your caregiver's instructions for how often to change the dressing.  Keep the dressing dry if one has been applied.  Do not scratch, pick, or rub the adhesive.  Do not place tape over the adhesive. The adhesive could come off when pulling the tape off.  Protect the wound from further injury until it is healed.  Protect the wound from sun and tanning bed exposure while it is healing and for several weeks after healing.  Keep all follow-up appointments as directed by your caregiver. SEEK IMMEDIATE MEDICAL  CARE IF:   Your wound becomes red, swollen, hot, or tender.  You have increasing pain in the wound.  You have a red streak that goes away from the wound.  You have pus coming from the wound.  You have a fever.  You have shaking chills.  There is a bad smell coming from the wound.  The wound or adhesive breaks open. MAKE SURE YOU:   Understand these instructions.  Will watch your condition.  Will get help right away if you are not doing well or get worse. Document Released: 12/31/2000 Document Revised: 09/29/2011 Document Reviewed: 11/10/2010 [facility] Patient Information 2013 [place], [place].
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
## 2012-11-19 ENCOUNTER — Other Ambulatory Visit (INDEPENDENT_AMBULATORY_CARE_PROVIDER_SITE_OTHER): Payer: Medicare Other | Admitting: *Deleted

## 2012-11-19 DIAGNOSIS — I6529 Occlusion and stenosis of unspecified carotid artery: Secondary | ICD-10-CM

## 2012-11-22 ENCOUNTER — Other Ambulatory Visit: Payer: Self-pay | Admitting: *Deleted

## 2012-11-24 ENCOUNTER — Encounter: Payer: Self-pay | Admitting: Family Medicine

## 2012-11-24 ENCOUNTER — Ambulatory Visit (INDEPENDENT_AMBULATORY_CARE_PROVIDER_SITE_OTHER): Payer: Medicare Other | Admitting: Family Medicine

## 2012-11-24 ENCOUNTER — Encounter: Payer: Self-pay | Admitting: Surgery

## 2012-11-24 VITALS — Ht 70.0 in | Wt 231.0 lb

## 2012-11-24 DIAGNOSIS — B079 Viral wart, unspecified: Secondary | ICD-10-CM

## 2012-11-24 NOTE — Progress Notes (Signed)
Procedure Only:  Cryotherapy  Reason: Wart Location: Finger, L thumb  Liquid nitrogen was applied using the liquid nitrogen gun without difficulty with an otoscope tip for concentration. Tolerated well without complications.

## 2012-12-09 ENCOUNTER — Ambulatory Visit: Payer: Medicare Other | Admitting: Neurosurgery

## 2012-12-09 ENCOUNTER — Other Ambulatory Visit: Payer: Medicare Other

## 2012-12-24 ENCOUNTER — Telehealth: Payer: Self-pay | Admitting: Family Medicine

## 2012-12-24 NOTE — Telephone Encounter (Signed)
In your inbox.

## 2012-12-24 NOTE — Telephone Encounter (Signed)
Marnette Burgess dropped off a request for excusal from jury duty service for Dr. Patsy Lager to approve. The best contact number for [facility] is [phone].  Thanks, Revonda Standard

## 2012-12-25 NOTE — Telephone Encounter (Signed)
Please call:  Is there anything going on that I don't know about?  A doctor's note won't excuse from jury duty unless something very severe like if you are dying or cannot walk from end stage emphysema.   If there is something over and above his past medical problems going on let me know. If not, though, I can't in good conscience write an excusal note.

## 2012-12-27 ENCOUNTER — Encounter: Payer: Self-pay | Admitting: *Deleted
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
## 2012-12-27 NOTE — Telephone Encounter (Signed)
i can write a letter saying this, but that does not guarantee that they will excuse him from jury duty.

## 2012-12-27 NOTE — Telephone Encounter (Signed)
Letter sent to you for editing

## 2013-01-10 ENCOUNTER — Ambulatory Visit (INDEPENDENT_AMBULATORY_CARE_PROVIDER_SITE_OTHER): Payer: Medicare Other | Admitting: Family Medicine

## 2013-01-10 ENCOUNTER — Encounter: Payer: Self-pay | Admitting: Family Medicine

## 2013-01-10 VITALS — BP 120/72 | HR 74 | Temp 98.0°F | Ht 70.0 in | Wt 229.0 lb

## 2013-01-10 DIAGNOSIS — Z125 Encounter for screening for malignant neoplasm of prostate: Secondary | ICD-10-CM

## 2013-01-10 DIAGNOSIS — K5900 Constipation, unspecified: Secondary | ICD-10-CM

## 2013-01-10 DIAGNOSIS — K219 Gastro-esophageal reflux disease without esophagitis: Secondary | ICD-10-CM

## 2013-01-10 LAB — PSA: PSA: 3.99 ng/mL (ref 0.10–4.00)
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
## 2013-01-10 NOTE — Patient Instructions (Signed)
2. Prevention: drink 8 glasses water daily, FIBER (raw fruit, veggies, bran cereal, whole grains), regular exercise 3. Bulk formers like Metamucil (psyllium), Citrucel (methylcellulose) usually help 4. Stool softeners (Docusate) occaisionally OK  5. Occaisional over the counter Miralax usually safe

## 2013-01-27 ENCOUNTER — Other Ambulatory Visit: Payer: Self-pay

## 2013-02-07 ENCOUNTER — Telehealth: Payer: Self-pay | Admitting: Oncology

## 2013-02-07 ENCOUNTER — Other Ambulatory Visit (HOSPITAL_BASED_OUTPATIENT_CLINIC_OR_DEPARTMENT_OTHER): Payer: Medicare Other | Admitting: Lab

## 2013-02-07 ENCOUNTER — Ambulatory Visit (HOSPITAL_BASED_OUTPATIENT_CLINIC_OR_DEPARTMENT_OTHER): Payer: Medicare Other | Admitting: Oncology

## 2013-02-07 VITALS — BP 175/79 | HR 65 | Temp 97.3°F | Resp 18 | Ht 70.0 in | Wt 230.0 lb

## 2013-02-07 DIAGNOSIS — C189 Malignant neoplasm of colon, unspecified: Secondary | ICD-10-CM

## 2013-02-07 DIAGNOSIS — C182 Malignant neoplasm of ascending colon: Secondary | ICD-10-CM

## 2013-02-07 NOTE — Progress Notes (Signed)
   [facility]    OFFICE PROGRESS NOTE   INTERVAL HISTORY:   He reports increased flatus and belching. Good appetite. No other specific complaint.  Objective:  Vital signs in last 24 hours:  Blood pressure 175/79, pulse 65, temperature 97.3 F (36.3 C), temperature source Oral, resp. rate 18, height 5\' 10"  (1.778 m), weight 230 lb (104.327 kg).    HEENT: Postsurgical and radiation changes at the bilateral neck. No discrete mass. The mouth is dry. Oropharynx without visible mass. Lymphatics: No cervical, supraclavicular, axillary, or inguinal nodes Resp: Lungs clear bilaterally Cardio: Regular rate and rhythm GI: No hepatomegaly, no mass Vascular: Chronic stasis change at the low leg bilaterally   Lab Results: CEA pending today CEA on 08/10/2012-2.2   Medications: I have reviewed the patient's current medications.  Assessment/Plan: 1.Stage II (T3 N0) well differentiated adenocarcinoma of the a sending colon, status post a right colectomy 06/25/2011 , negative surveillance colonoscopy 06/22/2012  2. Remote history of head and neck cancer  3. History of coronary artery disease  4. Diabetes  5. Episode of hemoptysis prior to hospital admission in December 2012.   Disposition:  He remains in clinical remission from colon cancer. He will return for an office visit and CEA in 6 months.   Thornton Papas, MD  02/07/2013  3:01 PM

## 2013-02-07 NOTE — Telephone Encounter (Signed)
Gave pt appt for lab and md January 2015

## 2013-02-09 LAB — CEA: CEA: 2.6 ng/mL (ref 0.0–5.0)

## 2013-02-14 ENCOUNTER — Ambulatory Visit (INDEPENDENT_AMBULATORY_CARE_PROVIDER_SITE_OTHER): Payer: Medicare Other | Admitting: Family Medicine

## 2013-02-14 ENCOUNTER — Encounter: Payer: Self-pay | Admitting: Family Medicine

## 2013-02-14 VITALS — BP 120/84 | HR 78 | Temp 97.8°F | Ht 70.0 in | Wt 230.2 lb

## 2013-02-14 DIAGNOSIS — R197 Diarrhea, unspecified: Secondary | ICD-10-CM

## 2013-02-14 DIAGNOSIS — C189 Malignant neoplasm of colon, unspecified: Secondary | ICD-10-CM

## 2013-02-14 DIAGNOSIS — R972 Elevated prostate specific antigen [PSA]: Secondary | ICD-10-CM

## 2013-02-14 LAB — CBC WITH DIFFERENTIAL/PLATELET
Basophils Absolute: 0.1 10*3/uL (ref 0.0–0.1)
Basophils Relative: 0.7 % (ref 0.0–3.0)
Eosinophils Absolute: 0.4 10*3/uL (ref 0.0–0.7)
Hemoglobin: 14.6 g/dL (ref 13.0–17.0)
Lymphocytes Relative: 18.3 % (ref 12.0–46.0)
MCHC: 33.8 g/dL (ref 30.0–36.0)
Monocytes Relative: 9.3 % (ref 3.0–12.0)
Neutrophils Relative %: 66.6 % (ref 43.0–77.0)
RBC: 4.69 Mil/uL (ref 4.22–5.81)
WBC: 8.3 10*3/uL (ref 4.5–10.5)

## 2013-02-14 LAB — HEPATIC FUNCTION PANEL
AST: 18 U/L (ref 0–37)
Alkaline Phosphatase: 65 U/L (ref 39–117)
Bilirubin, Direct: 0.1 mg/dL (ref 0.0–0.3)
Total Protein: 7.2 g/dL (ref 6.0–8.3)

## 2013-02-14 LAB — BASIC METABOLIC PANEL
CO2: 33 mEq/L — ABNORMAL HIGH (ref 19–32)
Calcium: 9.8 mg/dL (ref 8.4–10.5)
Creatinine, Ser: 1.4 mg/dL (ref 0.4–1.5)
GFR: 55.41 mL/min — ABNORMAL LOW (ref >60.00)
Sodium: 138 mEq/L (ref 135–145)
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
## 2013-02-14 NOTE — Patient Instructions (Addendum)
REFERRAL: GO THE THE FRONT ROOM AT THE ENTRANCE OF OUR CLINIC, NEAR CHECK IN. ASK FOR Michael Holder. SHE WILL HELP YOU SET UP YOUR REFERRAL. DATE: TIME:  

## 2013-02-18 ENCOUNTER — Ambulatory Visit (INDEPENDENT_AMBULATORY_CARE_PROVIDER_SITE_OTHER)
Admission: RE | Admit: 2013-02-18 | Discharge: 2013-02-18 | Disposition: A | Discharge status: Home / Self Care | Payer: Medicare Other | Source: Ambulatory Visit | Attending: Family Medicine | Admitting: Family Medicine

## 2013-02-18 DIAGNOSIS — R197 Diarrhea, unspecified: Secondary | ICD-10-CM

## 2013-02-18 DIAGNOSIS — C189 Malignant neoplasm of colon, unspecified: Secondary | ICD-10-CM

## 2013-02-18 MED ORDER — IOHEXOL 300 MG/ML  SOLN
100.0000 mL | Freq: Once | INTRAMUSCULAR | Status: AC | PRN
  Administered 2013-02-18: 100 mL via INTRAVENOUS

## 2013-05-06 ENCOUNTER — Ambulatory Visit (INDEPENDENT_AMBULATORY_CARE_PROVIDER_SITE_OTHER): Payer: Medicare Other | Admitting: Family Medicine

## 2013-05-06 ENCOUNTER — Encounter: Payer: Self-pay | Admitting: Family Medicine

## 2013-05-06 VITALS — BP 120/80 | HR 62 | Temp 97.8°F | Ht 70.0 in | Wt 226.5 lb

## 2013-05-06 DIAGNOSIS — J3489 Other specified disorders of nose and nasal sinuses: Secondary | ICD-10-CM

## 2013-05-06 DIAGNOSIS — R0981 Nasal congestion: Secondary | ICD-10-CM | POA: Insufficient documentation

## 2013-05-06 MED ORDER — FLUTICASONE PROPIONATE 50 MCG/ACT NA SUSP: 2.0000 | Freq: Every day | NASAL | Status: DC

## 2013-05-06 NOTE — Patient Instructions (Signed)
Treat with topical nasal steroid.  Consider antibiotics if not improving or if facial pain or fever.

## 2013-05-06 NOTE — Progress Notes (Signed)
  Subjective:    Patient ID: Michael Holder, male    DOB: Aug 08, 1945, 67 y.o.   MRN: [id number]  HPI  67 year old male pt of Dr.Copland's with history of throat cancer 2004, gastric ulcer, diabetes presents with new congestion in nose, sinus pressure, no pain. X 2 weeks.   Not able to blow anything out of nose. No cough, no post nasal drip. Has to breath through mouth lately... Gets slime in mouth. 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  Feels weak and like he is short of oxygen.  He has been tired lately x 2 weeks. No fever.  He uses nasal saline and vicks vapor rub.  Using miralax for constipation  bowel movements for several months. Seeing  Dr. Madilyn Fireman GI... Last saw him 1-2 months ago.. Started on probiotic.     Review of Systems  Constitutional: Positive for fatigue. Negative for fever.  HENT: Negative for ear pain and rhinorrhea.   Eyes: Negative for pain.  Respiratory: Negative for cough and wheezing.   Cardiovascular: Negative for chest pain.       Objective:   Physical Exam  Constitutional: Vital signs are normal. He appears well-developed and well-nourished.  [name] in NAD  HENT:  Head: Normocephalic.  Right Ear: Hearing normal.  Left Ear: Hearing normal.  Nose: Nose normal. Right sinus exhibits no maxillary sinus tenderness and no frontal sinus tenderness. Left sinus exhibits no maxillary sinus tenderness and no frontal sinus tenderness.  Mouth/Throat: Oropharynx is clear and moist and mucous membranes are normal. No oropharyngeal exudate, posterior oropharyngeal edema, posterior oropharyngeal erythema or tonsillar abscesses.  B cerumen, cannot see TMS B  Mouth is covered with thick white paste ( has had chronically since throat radiation) able to remove this with tissue  Neck: Trachea normal. Carotid bruit is not present. No mass and no thyromegaly present.  Cardiovascular: Normal rate, regular rhythm and normal pulses.  Exam reveals no  gallop, no distant heart sounds and no friction rub.   No murmur heard. No peripheral edema  Pulmonary/Chest: Effort normal and breath sounds normal. No respiratory distress.  Lymphadenopathy:  Absent in neck... Throat severely scarred.  Skin: Skin is warm, dry and intact. No rash noted.  Psychiatric: He has a normal mood and affect. His speech is normal and behavior is normal. Thought content normal.          Assessment & Plan:

## 2013-05-06 NOTE — Assessment & Plan Note (Signed)
No clear bacterial infection.. likely due to allergies.  Treat with topical nasal steroid. Consider antibiotics if not improving or if facial pain or fever.

## 2013-05-26 ENCOUNTER — Other Ambulatory Visit: Payer: Self-pay

## 2013-05-30 ENCOUNTER — Encounter: Payer: Self-pay | Admitting: Family Medicine

## 2013-05-30 ENCOUNTER — Ambulatory Visit (INDEPENDENT_AMBULATORY_CARE_PROVIDER_SITE_OTHER): Payer: Medicare Other | Admitting: Family Medicine

## 2013-05-30 VITALS — BP 130/90 | HR 63 | Temp 98.0°F | Ht 70.0 in | Wt 226.5 lb

## 2013-05-30 DIAGNOSIS — I700 Atherosclerosis of aorta: Secondary | ICD-10-CM

## 2013-05-30 DIAGNOSIS — I6529 Occlusion and stenosis of unspecified carotid artery: Secondary | ICD-10-CM

## 2013-05-30 DIAGNOSIS — E785 Hyperlipidemia, unspecified: Secondary | ICD-10-CM

## 2013-05-30 DIAGNOSIS — IMO0002 Reserved for concepts with insufficient information to code with codable children: Secondary | ICD-10-CM

## 2013-05-30 DIAGNOSIS — E1142 Type 2 diabetes mellitus with diabetic polyneuropathy: Secondary | ICD-10-CM

## 2013-05-30 DIAGNOSIS — S14127S Central cord syndrome at C7 level of cervical spinal cord, sequela: Secondary | ICD-10-CM

## 2013-05-30 DIAGNOSIS — C760 Malignant neoplasm of head, face and neck: Secondary | ICD-10-CM

## 2013-05-30 DIAGNOSIS — S329XXS Fracture of unspecified parts of lumbosacral spine and pelvis, sequela: Secondary | ICD-10-CM

## 2013-05-30 DIAGNOSIS — R935 Abnormal findings on diagnostic imaging of other abdominal regions, including retroperitoneum: Secondary | ICD-10-CM

## 2013-05-30 DIAGNOSIS — R911 Solitary pulmonary nodule: Secondary | ICD-10-CM

## 2013-05-30 DIAGNOSIS — C189 Malignant neoplasm of colon, unspecified: Secondary | ICD-10-CM

## 2013-05-30 DIAGNOSIS — Z9889 Other specified postprocedural states: Secondary | ICD-10-CM

## 2013-05-30 DIAGNOSIS — I2510 Atherosclerotic heart disease of native coronary artery without angina pectoris: Secondary | ICD-10-CM

## 2013-05-30 DIAGNOSIS — E1149 Type 2 diabetes mellitus with other diabetic neurological complication: Secondary | ICD-10-CM

## 2013-05-30 DIAGNOSIS — E1140 Type 2 diabetes mellitus with diabetic neuropathy, unspecified: Secondary | ICD-10-CM

## 2013-05-30 DIAGNOSIS — I2119 ST elevation (STEMI) myocardial infarction involving other coronary artery of inferior wall: Secondary | ICD-10-CM

## 2013-05-30 DIAGNOSIS — I10 Essential (primary) hypertension: Secondary | ICD-10-CM

## 2013-05-30 DIAGNOSIS — Z9861 Coronary angioplasty status: Secondary | ICD-10-CM

## 2013-05-30 LAB — LIPID PANEL
LDL Cholesterol: 88 mg/dL (ref 0–99)
Total CHOL/HDL Ratio: 4
VLDL: 23.6 mg/dL (ref 0.0–40.0)

## 2013-05-30 NOTE — Patient Instructions (Addendum)
REFERRAL: GO THE THE FRONT ROOM AT THE ENTRANCE OF OUR CLINIC, NEAR CHECK IN. ASK FOR Michael Holder. SHE WILL HELP YOU SET UP YOUR REFERRAL. DATE: TIME:   F/u 6 months 

## 2013-05-30 NOTE — Progress Notes (Signed)
Pre-visit discussion using our clinic review tool. No additional management support is needed unless otherwise documented below in the visit note.  
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
## 2013-05-31 ENCOUNTER — Encounter: Payer: Self-pay | Admitting: Family Medicine

## 2013-05-31 DIAGNOSIS — S14127A Central cord syndrome at C7 level of cervical spinal cord, initial encounter: Secondary | ICD-10-CM

## 2013-05-31 DIAGNOSIS — Z9861 Coronary angioplasty status: Secondary | ICD-10-CM | POA: Insufficient documentation

## 2013-05-31 DIAGNOSIS — I700 Atherosclerosis of aorta: Secondary | ICD-10-CM | POA: Insufficient documentation

## 2013-05-31 HISTORY — DX: Central cord syndrome at C7 level of cervical spinal cord, initial encounter: S14.127A

## 2013-06-13 ENCOUNTER — Other Ambulatory Visit: Payer: Self-pay | Admitting: Family Medicine

## 2013-06-13 ENCOUNTER — Ambulatory Visit (INDEPENDENT_AMBULATORY_CARE_PROVIDER_SITE_OTHER)
Admission: RE | Admit: 2013-06-13 | Discharge: 2013-06-13 | Disposition: A | Discharge status: Home / Self Care | Payer: Medicare Other | Source: Ambulatory Visit | Attending: Family Medicine | Admitting: Family Medicine

## 2013-06-13 DIAGNOSIS — R935 Abnormal findings on diagnostic imaging of other abdominal regions, including retroperitoneum: Secondary | ICD-10-CM

## 2013-06-13 DIAGNOSIS — R911 Solitary pulmonary nodule: Secondary | ICD-10-CM

## 2013-07-20 ENCOUNTER — Telehealth: Payer: Self-pay | Admitting: *Deleted

## 2013-07-20 NOTE — Telephone Encounter (Signed)
sw pt spouse informed her that GBS will be on call 08/12/13. gv appt for 08/18/13 w/labs @3 :15pm and ov@ 3:45pm.the patient spouse is aware...td

## 2013-07-29 ENCOUNTER — Other Ambulatory Visit: Payer: Self-pay

## 2013-07-29 MED ORDER — TAMSULOSIN HCL 0.4 MG PO CAPS: 0.4000 mg | ORAL_CAPSULE | Freq: Every day | ORAL | Status: DC

## 2013-07-29 MED ORDER — SIMVASTATIN 40 MG PO TABS: 40.0000 mg | ORAL_TABLET | Freq: Every day | ORAL | Status: DC

## 2013-07-29 MED ORDER — LISINOPRIL 40 MG PO TABS: 40.0000 mg | ORAL_TABLET | Freq: Every day | ORAL | Status: DC

## 2013-07-29 MED ORDER — METFORMIN HCL 500 MG PO TABS: 500.0000 mg | ORAL_TABLET | Freq: Every day | ORAL | Status: DC

## 2013-07-29 NOTE — Telephone Encounter (Signed)
Pt left note; has changed insurance co. And needs refills for lisinopril,metformin,simvastatin, and tamsulosin to [facility] order pharmacy fax # [phone]. Pt has already set up acct with [facility] and request rx faxed. Advised pt done.

## 2013-08-12 ENCOUNTER — Ambulatory Visit: Payer: Medicare Other | Admitting: Oncology

## 2013-08-12 ENCOUNTER — Other Ambulatory Visit: Payer: Medicare Other

## 2013-08-18 ENCOUNTER — Telehealth: Payer: Self-pay | Admitting: Oncology

## 2013-08-18 ENCOUNTER — Ambulatory Visit (HOSPITAL_BASED_OUTPATIENT_CLINIC_OR_DEPARTMENT_OTHER): Payer: Medicare HMO | Admitting: Oncology

## 2013-08-18 ENCOUNTER — Other Ambulatory Visit (HOSPITAL_BASED_OUTPATIENT_CLINIC_OR_DEPARTMENT_OTHER): Payer: Medicare HMO

## 2013-08-18 VITALS — BP 149/83 | HR 79 | Temp 97.4°F | Resp 18 | Ht 70.0 in | Wt 222.2 lb

## 2013-08-18 DIAGNOSIS — C182 Malignant neoplasm of ascending colon: Secondary | ICD-10-CM

## 2013-08-18 DIAGNOSIS — C189 Malignant neoplasm of colon, unspecified: Secondary | ICD-10-CM

## 2013-08-18 DIAGNOSIS — E119 Type 2 diabetes mellitus without complications: Secondary | ICD-10-CM

## 2013-08-18 DIAGNOSIS — R351 Nocturia: Secondary | ICD-10-CM

## 2013-08-18 NOTE — Progress Notes (Signed)
   [place]    OFFICE PROGRESS NOTE   INTERVAL HISTORY:   Michael Holder returns for scheduled followup of colon cancer. He complains of nocturia. He takes MiraLAX daily and his bowels are functioning. He continues to have a dry mouth. His appetite has decreased recently.  Objective:  Vital signs in last 24 hours:  Blood pressure 149/83, pulse 79, temperature 97.4 F (36.3 C), temperature source Oral, resp. rate 18, height 5\' 10"  (1.778 m), weight 222 lb 3.2 oz (100.789 kg).    HEENT: Drive mouth, postradiation and surgical changes over the neck Lymphatics: No cervical, supraclavicular, axillary, or inguinal nodes Resp: Decreased breath sounds with end inspiratory rhonchi at the left posterior base, no respiratory distress Cardio: Regular rate and rhythm GI: No hepatomegaly, nontender, no mass Vascular: No leg edema   Lab Results:  CEA pending   Medications: I have reviewed the patient's current medications.  Assessment/Plan: 1. Stage II (T3 N0) well differentiated adenocarcinoma of the a sending colon, status post a right colectomy 06/25/2011 , negative surveillance colonoscopy 06/22/2012  2. Remote history of head and neck cancer  3. History of coronary artery disease  4. Diabetes  5. Episode of hemoptysis prior to hospital admission in December 2012.    Disposition:  Michael Holder remains in clinical remission from colon cancer. He will return for an office visit and CEA in 6 months. I recommended he followup with Dr. Edilia Bo to evaluate the nocturia.   Betsy Coder, MD  08/18/2013  4:26 PM

## 2013-08-18 NOTE — Telephone Encounter (Signed)
Gave pt appt for lab and on july 2015

## 2013-08-19 LAB — CEA: CEA: 3.8 ng/mL (ref 0.0–5.0)

## 2013-08-25 ENCOUNTER — Other Ambulatory Visit: Payer: Self-pay | Admitting: Vascular Surgery

## 2013-08-25 DIAGNOSIS — I6529 Occlusion and stenosis of unspecified carotid artery: Secondary | ICD-10-CM

## 2013-09-12 ENCOUNTER — Encounter: Payer: Self-pay | Admitting: Internal Medicine

## 2013-09-12 ENCOUNTER — Ambulatory Visit (INDEPENDENT_AMBULATORY_CARE_PROVIDER_SITE_OTHER): Payer: Medicare HMO | Admitting: Internal Medicine

## 2013-09-12 VITALS — BP 128/78 | HR 63 | Temp 98.1°F | Wt 216.0 lb

## 2013-09-12 DIAGNOSIS — R3915 Urgency of urination: Secondary | ICD-10-CM

## 2013-09-12 DIAGNOSIS — N390 Urinary tract infection, site not specified: Secondary | ICD-10-CM

## 2013-09-12 LAB — POCT URINALYSIS DIPSTICK
Bilirubin, UA: NEGATIVE
GLUCOSE UA: NEGATIVE
Ketones, UA: NEGATIVE
Nitrite, UA: POSITIVE
PH UA: 6.5
Spec Grav, UA: 1.025
UROBILINOGEN UA: 0.2

## 2013-09-12 MED ORDER — SULFAMETHOXAZOLE-TMP DS 800-160 MG PO TABS: 1.0000 | ORAL_TABLET | Freq: Two times a day (BID) | ORAL | Status: DC

## 2013-09-12 NOTE — Progress Notes (Signed)
Pre visit review using our clinic review tool, if applicable. No additional management support is needed unless otherwise documented below in the visit note. 

## 2013-09-12 NOTE — Addendum Note (Signed)
Addended by: Lurlean Nanny on: 09/12/2013 02:49 PM   Modules accepted: Orders

## 2013-09-12 NOTE — Patient Instructions (Addendum)
Urinary Tract Infection  Urinary tract infections (UTIs) can develop anywhere along your urinary tract. Your urinary tract is your body's drainage system for removing wastes and extra water. Your urinary tract includes two kidneys, two ureters, a bladder, and a urethra. Your kidneys are a pair of bean-shaped organs. Each kidney is about the size of your fist. They are located below your ribs, one on each side of your spine.  CAUSES  Infections are caused by microbes, which are microscopic organisms, including fungi, viruses, and bacteria. These organisms are so small that they can only be seen through a microscope. Bacteria are the microbes that most commonly cause UTIs.  SYMPTOMS   Symptoms of UTIs may vary by age and gender of the patient and by the location of the infection. Symptoms in young women typically include a frequent and intense urge to urinate and a painful, burning feeling in the bladder or urethra during urination. Older women and men are more likely to be tired, shaky, and weak and have muscle aches and abdominal pain. A fever may mean the infection is in your kidneys. Other symptoms of a kidney infection include pain in your back or sides below the ribs, nausea, and vomiting.  DIAGNOSIS  To diagnose a UTI, your caregiver will ask you about your symptoms. Your caregiver also will ask to provide a urine sample. The urine sample will be tested for bacteria and white blood cells. White blood cells are made by your body to help fight infection.  TREATMENT   Typically, UTIs can be treated with medication. Because most UTIs are caused by a bacterial infection, they usually can be treated with the use of antibiotics. The choice of antibiotic and length of treatment depend on your symptoms and the type of bacteria causing your infection.  HOME CARE INSTRUCTIONS   If you were prescribed antibiotics, take them exactly as your caregiver instructs you. Finish the medication even if you feel better after you  have only taken some of the medication.   Drink enough water and fluids to keep your urine clear or pale yellow.   Avoid caffeine, tea, and carbonated beverages. They tend to irritate your bladder.   Empty your bladder often. Avoid holding urine for long periods of time.   Empty your bladder before and after sexual intercourse.   After a bowel movement, women should cleanse from front to back. Use each tissue only once.  SEEK MEDICAL CARE IF:    You have back pain.   You develop a fever.   Your symptoms do not begin to resolve within 3 days.  SEEK IMMEDIATE MEDICAL CARE IF:    You have severe back pain or lower abdominal pain.   You develop chills.   You have nausea or vomiting.   You have continued burning or discomfort with urination.  MAKE SURE YOU:    Understand these instructions.   Will watch your condition.   Will get help right away if you are not doing well or get worse.  Document Released: 04/16/2005 Document Revised: 01/06/2012 Document Reviewed: 08/15/2011  Exit[employer] Patient Information 2014 [employer].
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
## 2013-09-15 LAB — URINE CULTURE

## 2013-09-20 ENCOUNTER — Telehealth: Payer: Self-pay

## 2013-09-20 NOTE — Telephone Encounter (Signed)
I would have him continue it- make sure he is taking it with food. I can call him in something for nausea if he would like.

## 2013-09-20 NOTE — Telephone Encounter (Signed)
Spoke to pt and he states he will just go ahead and finish and does not want anything sent in at this time for nausea

## 2013-09-20 NOTE — Telephone Encounter (Signed)
Mrs Whilden left v/m pt has 5 Bactrim DS left and pt is very sick on stomach and no appetite, last night is when nausea and diarrhea started; should pt continue med or does a different antibiotic need to be called in to [facility].Please advise. Pt request cb.

## 2013-11-22 ENCOUNTER — Encounter: Payer: Self-pay | Admitting: Family

## 2013-11-23 ENCOUNTER — Encounter: Payer: Self-pay | Admitting: Family

## 2013-11-23 ENCOUNTER — Ambulatory Visit (HOSPITAL_COMMUNITY)
Admission: RE | Admit: 2013-11-23 | Discharge: 2013-11-23 | Disposition: A | Discharge status: Home / Self Care | Payer: Medicare HMO | Source: Ambulatory Visit | Attending: Family | Admitting: Family

## 2013-11-23 ENCOUNTER — Ambulatory Visit (INDEPENDENT_AMBULATORY_CARE_PROVIDER_SITE_OTHER): Payer: Medicare HMO | Admitting: Family

## 2013-11-23 VITALS — BP 144/83 | HR 71 | Resp 16 | Ht 69.0 in | Wt 217.0 lb

## 2013-11-23 DIAGNOSIS — I6529 Occlusion and stenosis of unspecified carotid artery: Secondary | ICD-10-CM | POA: Insufficient documentation

## 2013-11-23 DIAGNOSIS — Z48812 Encounter for surgical aftercare following surgery on the circulatory system: Secondary | ICD-10-CM | POA: Insufficient documentation
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
## 2013-11-23 NOTE — Patient Instructions (Signed)

## 2013-11-28 ENCOUNTER — Ambulatory Visit: Payer: Medicare Other | Admitting: Family Medicine

## 2013-12-01 ENCOUNTER — Ambulatory Visit (INDEPENDENT_AMBULATORY_CARE_PROVIDER_SITE_OTHER): Payer: Medicare HMO | Admitting: Family Medicine

## 2013-12-01 ENCOUNTER — Encounter: Payer: Self-pay | Admitting: Family Medicine

## 2013-12-01 VITALS — BP 116/82 | HR 60 | Temp 97.6°F | Ht 69.0 in | Wt 261.8 lb

## 2013-12-01 DIAGNOSIS — E785 Hyperlipidemia, unspecified: Secondary | ICD-10-CM

## 2013-12-01 DIAGNOSIS — E1159 Type 2 diabetes mellitus with other circulatory complications: Secondary | ICD-10-CM

## 2013-12-01 DIAGNOSIS — I10 Essential (primary) hypertension: Secondary | ICD-10-CM

## 2013-12-01 DIAGNOSIS — Z79899 Other long term (current) drug therapy: Secondary | ICD-10-CM

## 2013-12-01 DIAGNOSIS — E1142 Type 2 diabetes mellitus with diabetic polyneuropathy: Secondary | ICD-10-CM

## 2013-12-01 DIAGNOSIS — E1140 Type 2 diabetes mellitus with diabetic neuropathy, unspecified: Secondary | ICD-10-CM

## 2013-12-01 DIAGNOSIS — Z125 Encounter for screening for malignant neoplasm of prostate: Secondary | ICD-10-CM

## 2013-12-01 DIAGNOSIS — I2510 Atherosclerotic heart disease of native coronary artery without angina pectoris: Secondary | ICD-10-CM

## 2013-12-01 DIAGNOSIS — E1149 Type 2 diabetes mellitus with other diabetic neurological complication: Secondary | ICD-10-CM

## 2013-12-01 DIAGNOSIS — Z23 Encounter for immunization: Secondary | ICD-10-CM

## 2013-12-01 DIAGNOSIS — E890 Postprocedural hypothyroidism: Secondary | ICD-10-CM

## 2013-12-01 DIAGNOSIS — E1059 Type 1 diabetes mellitus with other circulatory complications: Secondary | ICD-10-CM

## 2013-12-01 LAB — CBC WITH DIFFERENTIAL/PLATELET
Basophils Absolute: 0 10*3/uL (ref 0.0–0.1)
Basophils Relative: 0.7 % (ref 0.0–3.0)
Eosinophils Absolute: 0.4 10*3/uL (ref 0.0–0.7)
Eosinophils Relative: 6.8 % — ABNORMAL HIGH (ref 0.0–5.0)
HCT: 41.5 % (ref 39.0–52.0)
Hemoglobin: 13.9 g/dL (ref 13.0–17.0)
LYMPHS PCT: 23 % (ref 12.0–46.0)
Lymphs Abs: 1.2 10*3/uL (ref 0.7–4.0)
MCHC: 33.4 g/dL (ref 30.0–36.0)
MCV: 93.5 fl (ref 78.0–100.0)
MONOS PCT: 10.2 % (ref 3.0–12.0)
Monocytes Absolute: 0.5 10*3/uL (ref 0.1–1.0)
Neutro Abs: 3.2 10*3/uL (ref 1.4–7.7)
Neutrophils Relative %: 59.3 % (ref 43.0–77.0)
PLATELETS: 135 10*3/uL — AB (ref 150.0–400.0)
RBC: 4.44 Mil/uL (ref 4.22–5.81)
RDW: 12.6 % (ref 11.5–15.5)
WBC: 5.4 10*3/uL (ref 4.0–10.5)

## 2013-12-01 LAB — HEMOGLOBIN A1C: HEMOGLOBIN A1C: 6 % (ref 4.6–6.5)

## 2013-12-01 LAB — BASIC METABOLIC PANEL
BUN: 10 mg/dL (ref 6–23)
CO2: 32 mEq/L (ref 19–32)
Calcium: 9.4 mg/dL (ref 8.4–10.5)
Chloride: 103 mEq/L (ref 96–112)
Creatinine, Ser: 1.2 mg/dL (ref 0.4–1.5)
GFR: 63.26 mL/min (ref >60.00)
Glucose, Bld: 89 mg/dL (ref 70–99)
Potassium: 4.4 mEq/L (ref 3.5–5.1)
Sodium: 140 mEq/L (ref 135–145)

## 2013-12-01 LAB — LIPID PANEL
CHOLESTEROL: 111 mg/dL (ref 0–200)
HDL: 34.1 mg/dL — AB (ref >39.00)
LDL Cholesterol: 55 mg/dL (ref 0–99)
TRIGLYCERIDES: 112 mg/dL (ref 0.0–149.0)
Total CHOL/HDL Ratio: 3
VLDL: 22.4 mg/dL (ref 0.0–40.0)

## 2013-12-01 LAB — PSA: PSA: 0.44 ng/mL (ref 0.10–4.00)

## 2013-12-01 NOTE — Progress Notes (Signed)
Michael Holder [id number] Phone: [phone] Fax: [phone]  Patient ID: Michael Holder MRN: [id number], DOB: 1945/12/11, 68 y.o. Date of Encounter: 12/01/2013  Primary Physician:  Owens Loffler, MD   Chief Complaint: Follow-up   Subjective:   History of Present Illness:  Michael Holder is a 68 y.o. very pleasant male patient who presents with the following:  Here for routine f/u. Basically doing well.  Diabetes Mellitus: Tolerating Medications: yes Compliance with diet: fair Exercise: minimal / intermittent Avg blood sugars at home: not checking Foot problems: none Hypoglycemia: none No nausea, vomitting, blurred vision, polyuria.  Lab Results  Component Value Date   HGBA1C 6.0 12/01/2013   HGBA1C 6.4 05/30/2013   HGBA1C 6.0 10/04/2012   Lab Results  Component Value Date   MICROALBUR 2.1* 10/04/2012   LDLCALC 55 12/01/2013   CREATININE 1.2 12/01/2013    Wt Readings from Last 3 Encounters:  12/01/13 261 lb 12 oz (118.729 kg)  11/23/13 217 lb (98.431 kg)  09/12/13 216 lb (97.977 kg)    Body mass index is 38.64 kg/(m^2).   HTN: Tolerating all medications without side effects Stable and at goal No CP, no sob. No HA.  BP Readings from Last 3 Encounters:  12/01/13 116/82  11/23/13 144/83  09/12/13 440/34    Basic Metabolic Panel:    Component Value Date/Time   NA 140 12/01/2013 1157   K 4.4 12/01/2013 1157   CL 103 12/01/2013 1157   CO2 32 12/01/2013 1157   BUN 10 12/01/2013 1157   CREATININE 1.2 12/01/2013 1157   CREATININE 1.47* 06/13/2011 1318   GLUCOSE 89 12/01/2013 1157   CALCIUM 9.4 12/01/2013 1157   Lipids: Doing well, stable. Tolerating meds fine with no SE. Panel reviewed with patient.  Lipids:    Component Value Date/Time   CHOL 111 12/01/2013 1157   TRIG 112.0 12/01/2013 1157   HDL 34.10* 12/01/2013 1157   VLDL 22.4 12/01/2013 1157   CHOLHDL 3 12/01/2013 1157    Lab Results  Component Value Date   ALT 20 02/14/2013   AST  18 02/14/2013   ALKPHOS 65 02/14/2013   BILITOT 0.5 02/14/2013     Past Medical History, Surgical History, Social History, Family History, Problem List, Medications, and Allergies have been reviewed and updated if relevant.  Review of Systems:  GEN: No acute illnesses, no fevers, chills. GI: No n/v/d, eating normally Pulm: No SOB Interactive and getting along well at home.  Otherwise, ROS is as per the HPI.  Objective:   Physical Examination: BP 116/82  Pulse 60  Temp(Src) 97.6 F (36.4 C) (Oral)  Ht 5\' 9"  (1.753 m)  Wt 261 lb 12 oz (118.729 kg)  BMI 38.64 kg/m2  SpO2 99%   GEN: WDWN, NAD, Non-toxic, A & O x 3 HEENT: Atraumatic, Normocephalic. Neck supple. No masses, No LAD. Ears and Nose: No external deformity. CV: RRR, No M/G/R. No JVD. No thrill. No extra heart sounds. PULM: CTA B, no wheezes, crackles, rhonchi. No retractions. No resp. distress. No accessory muscle use. EXTR: No c/c/e NEURO Normal gait.  PSYCH: Normally interactive. Conversant. Not depressed or anxious appearing.  Calm demeanor.   Laboratory and Imaging Data: Results for orders placed in visit on 74/25/95  BASIC METABOLIC PANEL      Result Value Ref Range   Sodium 140  135 - 145 mEq/L   Potassium 4.4  3.5 - 5.1 mEq/L   Chloride 103  96 - 112  mEq/L   CO2 32  19 - 32 mEq/L   Glucose, Bld 89  70 - 99 mg/dL   BUN 10  6 - 23 mg/dL   Creatinine, Ser 1.2  0.4 - 1.5 mg/dL   Calcium 9.4  8.4 - 10.5 mg/dL   GFR 63.26  >60.00 mL/min  CBC WITH DIFFERENTIAL      Result Value Ref Range   WBC 5.4  4.0 - 10.5 K/uL   RBC 4.44  4.22 - 5.81 Mil/uL   Hemoglobin 13.9  13.0 - 17.0 g/dL   HCT 41.5  39.0 - 52.0 %   MCV 93.5  78.0 - 100.0 fl   MCHC 33.4  30.0 - 36.0 g/dL   RDW 12.6  11.5 - 15.5 %   Platelets 135.0 (*) 150.0 - 400.0 K/uL   Neutrophils Relative % 59.3  43.0 - 77.0 %   Lymphocytes Relative 23.0  12.0 - 46.0 %   Monocytes Relative 10.2  3.0 - 12.0 %   Eosinophils Relative 6.8 (*) 0.0 - 5.0 %    Basophils Relative 0.7  0.0 - 3.0 %   Neutro Abs 3.2  1.4 - 7.7 K/uL   Lymphs Abs 1.2  0.7 - 4.0 K/uL   Monocytes Absolute 0.5  0.1 - 1.0 K/uL   Eosinophils Absolute 0.4  0.0 - 0.7 K/uL   Basophils Absolute 0.0  0.0 - 0.1 K/uL  HEMOGLOBIN A1C      Result Value Ref Range   Hemoglobin A1C 6.0  4.6 - 6.5 %  LIPID PANEL      Result Value Ref Range   Cholesterol 111  0 - 200 mg/dL   Triglycerides 112.0  0.0 - 149.0 mg/dL   HDL 34.10 (*) >39.00 mg/dL   VLDL 22.4  0.0 - 40.0 mg/dL   LDL Cholesterol 55  0 - 99 mg/dL   Total CHOL/HDL Ratio 3    PSA      Result Value Ref Range   PSA 0.44  0.10 - 4.00 ng/mL     Assessment & Plan:   Type II or unspecified type diabetes mellitus with peripheral circulatory disorders, uncontrolled(250.72) - Plan: Basic metabolic panel, Hemoglobin A1c  Type I (juvenile type) diabetes mellitus with peripheral circulatory disorders, uncontrolled(250.73)  Other and unspecified hyperlipidemia - Plan: Lipid panel  Encounter for long-term (current) use of other medications - Plan: CBC with Differential  Special screening for malignant neoplasm of prostate - Plan: PSA  Need for prophylactic vaccination against Streptococcus pneumoniae (pneumococcus) - Plan: Pneumococcal conjugate vaccine 13-valent  Type II or unspecified type diabetes mellitus with neurological manifestations, not stated as uncontrolled(250.60)  CORONARY ARTERY DISEASE  Diabetic neuropathy  HYPERTENSION  HYPOTHYROIDISM, POST-RADIATION  Stable and doing well.  Follow-up: Return in about 6 months (around 06/03/2014) for CPX. Unless noted above, the patient is to follow-up if symptoms worsen. Red flags were reviewed with the patient.  New Prescriptions   No medications on file   Orders Placed This Encounter  Procedures  . Pneumococcal conjugate vaccine 13-valent  . Basic metabolic panel  . CBC with Differential  . Hemoglobin A1c  . Lipid panel  . PSA    Signed,  Frederico Hamman T.  Marlee Trentman, MD, [place]   Patient's Medications  New Prescriptions   No medications on file  Previous Medications   ASPIRIN 81 MG EC TABLET    Take 81 mg by mouth daily.    LISINOPRIL (PRINIVIL,ZESTRIL) 40 MG TABLET    Take 1  tablet (40 mg total) by mouth daily.   METFORMIN (GLUCOPHAGE) 500 MG TABLET    Take 1 tablet (500 mg total) by mouth daily.   POLYETHYLENE GLYCOL POWDER (MIRALAX) POWDER    Take 17 g by mouth daily.   SIMVASTATIN (ZOCOR) 40 MG TABLET    Take 1 tablet (40 mg total) by mouth at bedtime.   TAMSULOSIN (FLOMAX) 0.4 MG CAPS CAPSULE    Take 1 capsule (0.4 mg total) by mouth daily after breakfast.  Modified Medications   No medications on file  Discontinued Medications   FLUTICASONE (FLONASE) 50 MCG/ACT NASAL SPRAY    Place 2 sprays into the nose daily.   SULFAMETHOXAZOLE-TRIMETHOPRIM (BACTRIM DS) 800-160 MG PER TABLET    Take 1 tablet by mouth 2 (two) times daily.

## 2013-12-01 NOTE — Progress Notes (Signed)
Pre visit review using our clinic review tool, if applicable. No additional management support is needed unless otherwise documented below in the visit note. 

## 2013-12-02 DIAGNOSIS — E669 Obesity, unspecified: Secondary | ICD-10-CM | POA: Insufficient documentation

## 2013-12-05 ENCOUNTER — Other Ambulatory Visit: Payer: Self-pay | Admitting: *Deleted

## 2013-12-05 MED ORDER — LISINOPRIL 40 MG PO TABS: 40.0000 mg | ORAL_TABLET | Freq: Every day | ORAL | Status: DC

## 2013-12-05 MED ORDER — TAMSULOSIN HCL 0.4 MG PO CAPS: 0.4000 mg | ORAL_CAPSULE | Freq: Every day | ORAL | Status: AC

## 2013-12-05 MED ORDER — METFORMIN HCL 500 MG PO TABS: 500.0000 mg | ORAL_TABLET | Freq: Every day | ORAL | Status: DC

## 2013-12-05 MED ORDER — SIMVASTATIN 40 MG PO TABS: 40.0000 mg | ORAL_TABLET | Freq: Every day | ORAL | Status: AC

## 2013-12-31 ENCOUNTER — Telehealth: Payer: Self-pay | Admitting: Oncology

## 2013-12-31 NOTE — Telephone Encounter (Signed)
PAL - moved 7/31 appt to 8/18. s/w pt he is aware.

## 2014-02-17 ENCOUNTER — Other Ambulatory Visit: Payer: Medicare HMO

## 2014-02-17 ENCOUNTER — Ambulatory Visit: Payer: Medicare HMO | Admitting: Oncology

## 2014-03-03 ENCOUNTER — Telehealth: Payer: Self-pay | Admitting: Oncology

## 2014-03-03 NOTE — Telephone Encounter (Signed)
call day 8/18 - moved lb/bs to am - s/w pt he is aware.

## 2014-03-07 ENCOUNTER — Telehealth: Payer: Self-pay | Admitting: Oncology

## 2014-03-07 ENCOUNTER — Other Ambulatory Visit (HOSPITAL_BASED_OUTPATIENT_CLINIC_OR_DEPARTMENT_OTHER): Payer: Medicare HMO

## 2014-03-07 ENCOUNTER — Ambulatory Visit (HOSPITAL_BASED_OUTPATIENT_CLINIC_OR_DEPARTMENT_OTHER): Payer: Medicare HMO | Admitting: Oncology

## 2014-03-07 VITALS — BP 139/68 | HR 72 | Temp 98.3°F | Resp 18 | Ht 69.0 in | Wt 215.2 lb

## 2014-03-07 DIAGNOSIS — C182 Malignant neoplasm of ascending colon: Secondary | ICD-10-CM

## 2014-03-07 DIAGNOSIS — E119 Type 2 diabetes mellitus without complications: Secondary | ICD-10-CM

## 2014-03-07 DIAGNOSIS — C189 Malignant neoplasm of colon, unspecified: Secondary | ICD-10-CM

## 2014-03-07 DIAGNOSIS — I2510 Atherosclerotic heart disease of native coronary artery without angina pectoris: Secondary | ICD-10-CM

## 2014-03-07 LAB — CEA: CEA: 2.3 ng/mL (ref 0.0–5.0)

## 2014-03-07 NOTE — Telephone Encounter (Signed)
Pt confirmed labs/ov per 08/18 POF, gave pt AVS...KJ °

## 2014-03-07 NOTE — Progress Notes (Signed)
  [facility] OFFICE PROGRESS NOTE   Diagnosis: Colon cancer  INTERVAL HISTORY:   He returns as scheduled. He feels well. Good appetite. No specific complaint.  Objective:  Vital signs in last 24 hours:  Blood pressure 139/68, pulse 72, temperature 98.3 F (36.8 C), temperature source Oral, resp. rate 18, height 5\' 9"  (1.753 m), weight 215 lb 3.2 oz (97.614 kg), SpO2 95.00%.    HEENT: Postsurgical and radiation changes at the bilateral neck. No mass. Lymphatics: No cervical, supraclavicular, axillary, or inguinal nodes Resp: Decreased breath sounds at the left lower chest, no respiratory distress Cardio: Regular rate and rhythm GI: No hepatosplenomegaly, nontender, no mass Vascular: No leg edema   Lab Results:  Lab Results  Component Value Date   CEA 3.8 08/18/2013    Medications: I have reviewed the patient's current medications.  Assessment/Plan: 1. Stage II (T3 N0) well differentiated adenocarcinoma of the a sending colon, status post a right colectomy 06/25/2011 , negative surveillance colonoscopy 06/22/2012  2. Remote history of head and neck cancer  3. History of coronary artery disease  4. Diabetes  5. Episode of hemoptysis prior to hospital admission in December 2012.     Disposition:  Mr. Claxton remains in clinical remission from colon cancer. He will return for office visit and CEA in 6 months. We will followup on the CEA from today.  Betsy Coder, MD  03/07/2014  9:30 AM

## 2014-03-09 ENCOUNTER — Telehealth: Payer: Self-pay | Admitting: *Deleted

## 2014-03-09 NOTE — Telephone Encounter (Signed)
Per Dr. Sherrill; notified pt that cea is normal.  Pt verbalized understanding. 

## 2014-03-09 NOTE — Telephone Encounter (Signed)
Message copied by Domenic Schwab on Thu Mar 09, 2014  1:54 PM ------      Message from: Ladell Pier      Created: Tue Mar 07, 2014 10:08 PM       Please call patient, cea is nornal ------

## 2014-05-31 ENCOUNTER — Other Ambulatory Visit (INDEPENDENT_AMBULATORY_CARE_PROVIDER_SITE_OTHER): Payer: Medicare HMO

## 2014-05-31 DIAGNOSIS — E1159 Type 2 diabetes mellitus with other circulatory complications: Secondary | ICD-10-CM

## 2014-05-31 DIAGNOSIS — I10 Essential (primary) hypertension: Secondary | ICD-10-CM

## 2014-05-31 DIAGNOSIS — E1151 Type 2 diabetes mellitus with diabetic peripheral angiopathy without gangrene: Secondary | ICD-10-CM

## 2014-05-31 DIAGNOSIS — E890 Postprocedural hypothyroidism: Secondary | ICD-10-CM

## 2014-05-31 DIAGNOSIS — Z79899 Other long term (current) drug therapy: Secondary | ICD-10-CM

## 2014-05-31 LAB — MICROALBUMIN / CREATININE URINE RATIO
CREATININE, U: 146.8 mg/dL
MICROALB/CREAT RATIO: 9.3 mg/g (ref 0.0–30.0)
Microalb, Ur: 13.6 mg/dL — ABNORMAL HIGH (ref 0.0–1.9)

## 2014-05-31 LAB — CBC WITH DIFFERENTIAL/PLATELET
BASOS PCT: 0.7 % (ref 0.0–3.0)
Basophils Absolute: 0 10*3/uL (ref 0.0–0.1)
EOS PCT: 5.7 % — AB (ref 0.0–5.0)
Eosinophils Absolute: 0.4 10*3/uL (ref 0.0–0.7)
HEMATOCRIT: 40.9 % (ref 39.0–52.0)
Hemoglobin: 13.4 g/dL (ref 13.0–17.0)
LYMPHS ABS: 1.7 10*3/uL (ref 0.7–4.0)
Lymphocytes Relative: 26.1 % (ref 12.0–46.0)
MCHC: 32.7 g/dL (ref 30.0–36.0)
MCV: 92.8 fl (ref 78.0–100.0)
MONO ABS: 0.6 10*3/uL (ref 0.1–1.0)
Monocytes Relative: 9.1 % (ref 3.0–12.0)
Neutro Abs: 3.7 10*3/uL (ref 1.4–7.7)
Neutrophils Relative %: 58.4 % (ref 43.0–77.0)
PLATELETS: 136 10*3/uL — AB (ref 150.0–400.0)
RBC: 4.41 Mil/uL (ref 4.22–5.81)
RDW: 12.7 % (ref 11.5–15.5)
WBC: 6.4 10*3/uL (ref 4.0–10.5)

## 2014-05-31 LAB — HEPATIC FUNCTION PANEL
ALK PHOS: 64 U/L (ref 39–117)
ALT: 14 U/L (ref 0–53)
AST: 17 U/L (ref 0–37)
Albumin: 3.5 g/dL (ref 3.5–5.2)
Bilirubin, Direct: 0.1 mg/dL (ref 0.0–0.3)
Total Bilirubin: 0.7 mg/dL (ref 0.2–1.2)
Total Protein: 7 g/dL (ref 6.0–8.3)

## 2014-05-31 LAB — BASIC METABOLIC PANEL
BUN: 30 mg/dL — ABNORMAL HIGH (ref 6–23)
CHLORIDE: 102 meq/L (ref 96–112)
CO2: 30 meq/L (ref 19–32)
CREATININE: 2.2 mg/dL — AB (ref 0.4–1.5)
Calcium: 9.3 mg/dL (ref 8.4–10.5)
GFR: 32.54 mL/min — ABNORMAL LOW (ref >60.00)
Glucose, Bld: 109 mg/dL — ABNORMAL HIGH (ref 70–99)
Potassium: 4.6 mEq/L (ref 3.5–5.1)
Sodium: 138 mEq/L (ref 135–145)

## 2014-05-31 LAB — TSH: TSH: 9.65 u[IU]/mL — ABNORMAL HIGH (ref 0.35–4.50)

## 2014-05-31 LAB — HEMOGLOBIN A1C: Hgb A1c MFr Bld: 6 % (ref 4.6–6.5)

## 2014-06-07 ENCOUNTER — Other Ambulatory Visit: Payer: Self-pay | Admitting: Neurosurgery

## 2014-06-07 ENCOUNTER — Encounter: Payer: Self-pay | Admitting: Family Medicine

## 2014-06-07 ENCOUNTER — Ambulatory Visit (INDEPENDENT_AMBULATORY_CARE_PROVIDER_SITE_OTHER): Payer: Medicare HMO | Admitting: Family Medicine

## 2014-06-07 VITALS — BP 122/78 | HR 65 | Temp 98.3°F | Ht 67.75 in | Wt 207.2 lb

## 2014-06-07 DIAGNOSIS — E669 Obesity, unspecified: Secondary | ICD-10-CM

## 2014-06-07 DIAGNOSIS — C189 Malignant neoplasm of colon, unspecified: Secondary | ICD-10-CM

## 2014-06-07 DIAGNOSIS — C760 Malignant neoplasm of head, face and neck: Secondary | ICD-10-CM

## 2014-06-07 DIAGNOSIS — Z9889 Other specified postprocedural states: Secondary | ICD-10-CM

## 2014-06-07 DIAGNOSIS — K219 Gastro-esophageal reflux disease without esophagitis: Secondary | ICD-10-CM

## 2014-06-07 DIAGNOSIS — C4442 Squamous cell carcinoma of skin of scalp and neck: Secondary | ICD-10-CM

## 2014-06-07 DIAGNOSIS — Z0000 Encounter for general adult medical examination without abnormal findings: Secondary | ICD-10-CM

## 2014-06-07 DIAGNOSIS — N179 Acute kidney failure, unspecified: Secondary | ICD-10-CM

## 2014-06-07 DIAGNOSIS — I10 Essential (primary) hypertension: Secondary | ICD-10-CM

## 2014-06-07 DIAGNOSIS — Z9861 Coronary angioplasty status: Secondary | ICD-10-CM

## 2014-06-07 DIAGNOSIS — S14127S Central cord syndrome at C7 level of cervical spinal cord, sequela: Secondary | ICD-10-CM

## 2014-06-07 DIAGNOSIS — E1140 Type 2 diabetes mellitus with diabetic neuropathy, unspecified: Secondary | ICD-10-CM

## 2014-06-07 DIAGNOSIS — I2510 Atherosclerotic heart disease of native coronary artery without angina pectoris: Secondary | ICD-10-CM

## 2014-06-07 DIAGNOSIS — K5730 Diverticulosis of large intestine without perforation or abscess without bleeding: Secondary | ICD-10-CM

## 2014-06-07 DIAGNOSIS — E890 Postprocedural hypothyroidism: Secondary | ICD-10-CM

## 2014-06-07 DIAGNOSIS — E0843 Diabetes mellitus due to underlying condition with diabetic autonomic (poly)neuropathy: Secondary | ICD-10-CM

## 2014-06-07 DIAGNOSIS — K259 Gastric ulcer, unspecified as acute or chronic, without hemorrhage or perforation: Secondary | ICD-10-CM

## 2014-06-07 DIAGNOSIS — I700 Atherosclerosis of aorta: Secondary | ICD-10-CM

## 2014-06-07 DIAGNOSIS — I252 Old myocardial infarction: Secondary | ICD-10-CM

## 2014-06-07 DIAGNOSIS — M47812 Spondylosis without myelopathy or radiculopathy, cervical region: Secondary | ICD-10-CM

## 2014-06-07 DIAGNOSIS — E785 Hyperlipidemia, unspecified: Secondary | ICD-10-CM

## 2014-06-07 LAB — BASIC METABOLIC PANEL
BUN: 31 mg/dL — AB (ref 6–23)
CALCIUM: 9.3 mg/dL (ref 8.4–10.5)
CO2: 27 mEq/L (ref 19–32)
Chloride: 103 mEq/L (ref 96–112)
Creatinine, Ser: 1.8 mg/dL — ABNORMAL HIGH (ref 0.4–1.5)
GFR: 39.94 mL/min — ABNORMAL LOW (ref >60.00)
GLUCOSE: 130 mg/dL — AB (ref 70–99)
Potassium: 4.8 mEq/L (ref 3.5–5.1)
Sodium: 141 mEq/L (ref 135–145)

## 2014-06-07 LAB — T4, FREE: Free T4: 0.84 ng/dL (ref 0.60–1.60)

## 2014-06-07 LAB — TSH: TSH: 7.8 u[IU]/mL — ABNORMAL HIGH (ref 0.35–4.50)

## 2014-06-07 LAB — T3, FREE: T3, Free: 2.9 pg/mL (ref 2.3–4.2)

## 2014-06-07 NOTE — Patient Instructions (Signed)
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
## 2014-06-07 NOTE — Progress Notes (Signed)
Pre visit review using our clinic review tool, if applicable. No additional management support is needed unless otherwise documented below in the visit note. 

## 2014-06-12 DIAGNOSIS — I252 Old myocardial infarction: Secondary | ICD-10-CM | POA: Insufficient documentation

## 2014-06-13 ENCOUNTER — Inpatient Hospital Stay: Admission: RE | Admit: 2014-06-13 | Payer: Medicare HMO | Source: Ambulatory Visit

## 2014-06-14 ENCOUNTER — Telehealth: Payer: Self-pay | Admitting: *Deleted

## 2014-06-14 DIAGNOSIS — N19 Unspecified kidney failure: Secondary | ICD-10-CM

## 2014-06-14 MED ORDER — GLIPIZIDE ER 5 MG PO TB24: 5.0000 mg | ORAL_TABLET | Freq: Every day | ORAL | Status: AC

## 2014-06-14 NOTE — Telephone Encounter (Signed)
-----   Message from Owens Loffler, MD sent at 06/13/2014  6:31 PM EST ----- Can you call:  Active thyroid hormone levels came back normal. He has "subclinical hypothyroidism," and I don't think he needs any medicine.   Kidney function has improved, but not to baseline.  Keep off metformin.   Start glipizide ER 5 mg daily, #30, 5 ref.  Recheck BMP in 1 month: renal failure

## 2014-06-14 NOTE — Telephone Encounter (Signed)
[name] notified as instructed by telephone.  Glipizide ER 5 mg sent to his [facility].  Lab appointment scheduled 07/12/2014 at 7:50 am.

## 2014-06-24 ENCOUNTER — Ambulatory Visit
Admission: RE | Admit: 2014-06-24 | Discharge: 2014-06-24 | Disposition: A | Discharge status: Home / Self Care | Payer: Medicare HMO | Source: Ambulatory Visit | Attending: Neurosurgery | Admitting: Neurosurgery

## 2014-06-24 DIAGNOSIS — M47812 Spondylosis without myelopathy or radiculopathy, cervical region: Secondary | ICD-10-CM

## 2014-07-12 ENCOUNTER — Other Ambulatory Visit (INDEPENDENT_AMBULATORY_CARE_PROVIDER_SITE_OTHER): Payer: Medicare HMO

## 2014-07-12 DIAGNOSIS — N19 Unspecified kidney failure: Secondary | ICD-10-CM

## 2014-07-12 LAB — BASIC METABOLIC PANEL
BUN: 27 mg/dL — ABNORMAL HIGH (ref 6–23)
CALCIUM: 9.1 mg/dL (ref 8.4–10.5)
CO2: 31 meq/L (ref 19–32)
CREATININE: 1.8 mg/dL — AB (ref 0.4–1.5)
Chloride: 103 mEq/L (ref 96–112)
GFR: 40.19 mL/min — ABNORMAL LOW (ref >60.00)
GLUCOSE: 74 mg/dL (ref 70–99)
Potassium: 4.7 mEq/L (ref 3.5–5.1)
Sodium: 140 mEq/L (ref 135–145)

## 2014-08-30 ENCOUNTER — Telehealth: Payer: Self-pay | Admitting: Family Medicine

## 2014-08-30 NOTE — Telephone Encounter (Signed)
Letter placed in Dr. Lillie Fragmin in box.

## 2014-08-30 NOTE — Telephone Encounter (Signed)
pts wife stopped by to drop off a letter for Dr Lorelei Pont regarding pt's state of mind.  She will be with pt at next ap[pointment and wants Dr Lorelei Pont to know her concerns before appointment.  Placing on donna's desk

## 2014-08-30 NOTE — Telephone Encounter (Signed)
Reviewed

## 2014-09-11 ENCOUNTER — Ambulatory Visit (INDEPENDENT_AMBULATORY_CARE_PROVIDER_SITE_OTHER): Payer: Medicare HMO | Admitting: Family Medicine

## 2014-09-11 ENCOUNTER — Encounter: Payer: Self-pay | Admitting: Family Medicine

## 2014-09-11 ENCOUNTER — Ambulatory Visit (INDEPENDENT_AMBULATORY_CARE_PROVIDER_SITE_OTHER)
Admission: RE | Admit: 2014-09-11 | Discharge: 2014-09-11 | Disposition: A | Discharge status: Home / Self Care | Payer: Medicare HMO | Source: Ambulatory Visit | Attending: Family Medicine | Admitting: Family Medicine

## 2014-09-11 VITALS — BP 110/70 | HR 64 | Temp 98.1°F | Wt 198.0 lb

## 2014-09-11 DIAGNOSIS — M545 Low back pain, unspecified: Secondary | ICD-10-CM

## 2014-09-11 DIAGNOSIS — R42 Dizziness and giddiness: Secondary | ICD-10-CM | POA: Insufficient documentation

## 2014-09-11 MED ORDER — GLUCOSE BLOOD VI STRP: ORAL_STRIP | Status: AC

## 2014-09-11 MED ORDER — ONETOUCH ULTRA SYSTEM W/DEVICE KIT: 1.0000 | PACK | Freq: Once | Status: AC

## 2014-09-11 MED ORDER — ONETOUCH ULTRASOFT LANCETS MISC: Status: AC

## 2014-09-11 MED ORDER — LISINOPRIL 20 MG PO TABS: 20.0000 mg | ORAL_TABLET | Freq: Every day | ORAL | Status: DC

## 2014-09-11 NOTE — Assessment & Plan Note (Addendum)
Endorses longstanding issue with this - I suggested he check bp and cbg next dizzy episode to r/o hypotension/hypoglycemia as causes.  BP Readings from Last 3 Encounters:  09/11/14 110/70  06/07/14 122/78  03/07/14 139/68  will also decrease lisinopril to 20mg  daily and have pt f/u at next appt with PCP on Thursday.
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
## 2014-09-11 NOTE — Progress Notes (Signed)
Pre visit review using our clinic review tool, if applicable. No additional management support is needed unless otherwise documented below in the visit note. 

## 2014-09-11 NOTE — Assessment & Plan Note (Addendum)
Sounds like mechanical fall. Discussed importance of fall prevention, discussed home safety, to discuss possible PT referral with PCP.  Check lumbar xray to r/o fracture given midline pain. Treat with tylenol 500mg  tid and update if not improving with treatment. Avoid NSAIDs 2/2 CKD. Update if not improving as expected.

## 2014-09-11 NOTE — Patient Instructions (Addendum)
Xray today.  When you feel dizzy, check blood pressure and sugar. Glucose meter sent to your pharmacy. I think you had a bruise or possible lumbar strain after your fall - treat with rest, heating pad, tylenol 500mg  three times daily until pain improving. If worsening pain, please seek care or let us know.

## 2014-09-12 ENCOUNTER — Other Ambulatory Visit: Payer: Medicare HMO

## 2014-09-12 ENCOUNTER — Ambulatory Visit: Payer: Medicare HMO | Admitting: Oncology

## 2014-09-13 ENCOUNTER — Encounter: Payer: Self-pay | Admitting: Family Medicine

## 2014-09-13 ENCOUNTER — Ambulatory Visit (INDEPENDENT_AMBULATORY_CARE_PROVIDER_SITE_OTHER): Payer: Medicare HMO | Admitting: Family Medicine

## 2014-09-13 VITALS — BP 87/61 | HR 71 | Temp 97.6°F | Ht 67.75 in | Wt 196.2 lb

## 2014-09-13 DIAGNOSIS — M545 Low back pain, unspecified: Secondary | ICD-10-CM

## 2014-09-13 DIAGNOSIS — F331 Major depressive disorder, recurrent, moderate: Secondary | ICD-10-CM

## 2014-09-13 MED ORDER — CITALOPRAM HYDROBROMIDE 20 MG PO TABS: 20.0000 mg | ORAL_TABLET | Freq: Every day | ORAL | Status: DC
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
## 2014-09-13 NOTE — Progress Notes (Signed)
Pre visit review using our clinic review tool, if applicable. No additional management support is needed unless otherwise documented below in the visit note. 

## 2014-09-15 DIAGNOSIS — F331 Major depressive disorder, recurrent, moderate: Secondary | ICD-10-CM | POA: Insufficient documentation

## 2014-09-22 ENCOUNTER — Inpatient Hospital Stay (HOSPITAL_COMMUNITY)
Admission: EM | Admit: 2014-09-22 | Discharge: 2014-09-28 | DRG: 871 | Disposition: A | Discharge status: Skilled Nursing Facility | Payer: Medicare HMO | Attending: Internal Medicine | Admitting: Internal Medicine

## 2014-09-22 ENCOUNTER — Encounter (HOSPITAL_COMMUNITY): Payer: Self-pay | Admitting: Emergency Medicine

## 2014-09-22 DIAGNOSIS — N179 Acute kidney failure, unspecified: Secondary | ICD-10-CM | POA: Diagnosis present

## 2014-09-22 DIAGNOSIS — A419 Sepsis, unspecified organism: Principal | ICD-10-CM | POA: Diagnosis present

## 2014-09-22 DIAGNOSIS — C4442 Squamous cell carcinoma of skin of scalp and neck: Secondary | ICD-10-CM | POA: Diagnosis present

## 2014-09-22 DIAGNOSIS — C189 Malignant neoplasm of colon, unspecified: Secondary | ICD-10-CM | POA: Diagnosis present

## 2014-09-22 DIAGNOSIS — Z823 Family history of stroke: Secondary | ICD-10-CM

## 2014-09-22 DIAGNOSIS — I252 Old myocardial infarction: Secondary | ICD-10-CM

## 2014-09-22 DIAGNOSIS — R4182 Altered mental status, unspecified: Secondary | ICD-10-CM | POA: Diagnosis present

## 2014-09-22 DIAGNOSIS — N189 Chronic kidney disease, unspecified: Secondary | ICD-10-CM

## 2014-09-22 DIAGNOSIS — E871 Hypo-osmolality and hyponatremia: Secondary | ICD-10-CM | POA: Diagnosis present

## 2014-09-22 DIAGNOSIS — Z7982 Long term (current) use of aspirin: Secondary | ICD-10-CM

## 2014-09-22 DIAGNOSIS — E785 Hyperlipidemia, unspecified: Secondary | ICD-10-CM | POA: Diagnosis present

## 2014-09-22 DIAGNOSIS — E039 Hypothyroidism, unspecified: Secondary | ICD-10-CM | POA: Diagnosis present

## 2014-09-22 DIAGNOSIS — Z85038 Personal history of other malignant neoplasm of large intestine: Secondary | ICD-10-CM

## 2014-09-22 DIAGNOSIS — N3001 Acute cystitis with hematuria: Secondary | ICD-10-CM

## 2014-09-22 DIAGNOSIS — G9341 Metabolic encephalopathy: Secondary | ICD-10-CM | POA: Diagnosis present

## 2014-09-22 DIAGNOSIS — Z981 Arthrodesis status: Secondary | ICD-10-CM

## 2014-09-22 DIAGNOSIS — Z87891 Personal history of nicotine dependence: Secondary | ICD-10-CM

## 2014-09-22 DIAGNOSIS — R410 Disorientation, unspecified: Secondary | ICD-10-CM | POA: Diagnosis not present

## 2014-09-22 DIAGNOSIS — E860 Dehydration: Secondary | ICD-10-CM | POA: Diagnosis present

## 2014-09-22 DIAGNOSIS — G9340 Encephalopathy, unspecified: Secondary | ICD-10-CM

## 2014-09-22 DIAGNOSIS — N390 Urinary tract infection, site not specified: Secondary | ICD-10-CM | POA: Diagnosis present

## 2014-09-22 DIAGNOSIS — I959 Hypotension, unspecified: Secondary | ICD-10-CM

## 2014-09-22 DIAGNOSIS — R0902 Hypoxemia: Secondary | ICD-10-CM

## 2014-09-22 DIAGNOSIS — N183 Chronic kidney disease, stage 3 (moderate): Secondary | ICD-10-CM | POA: Diagnosis present

## 2014-09-22 DIAGNOSIS — Z9049 Acquired absence of other specified parts of digestive tract: Secondary | ICD-10-CM | POA: Diagnosis present

## 2014-09-22 DIAGNOSIS — Z833 Family history of diabetes mellitus: Secondary | ICD-10-CM

## 2014-09-22 DIAGNOSIS — I2510 Atherosclerotic heart disease of native coronary artery without angina pectoris: Secondary | ICD-10-CM | POA: Diagnosis present

## 2014-09-22 DIAGNOSIS — I129 Hypertensive chronic kidney disease with stage 1 through stage 4 chronic kidney disease, or unspecified chronic kidney disease: Secondary | ICD-10-CM | POA: Diagnosis present

## 2014-09-22 DIAGNOSIS — D696 Thrombocytopenia, unspecified: Secondary | ICD-10-CM | POA: Diagnosis present

## 2014-09-22 DIAGNOSIS — E1149 Type 2 diabetes mellitus with other diabetic neurological complication: Secondary | ICD-10-CM | POA: Diagnosis present

## 2014-09-22 DIAGNOSIS — Z79899 Other long term (current) drug therapy: Secondary | ICD-10-CM

## 2014-09-22 DIAGNOSIS — Z8249 Family history of ischemic heart disease and other diseases of the circulatory system: Secondary | ICD-10-CM

## 2014-09-22 DIAGNOSIS — R441 Visual hallucinations: Secondary | ICD-10-CM | POA: Diagnosis present

## 2014-09-22 LAB — COMPREHENSIVE METABOLIC PANEL
ALT: 16 U/L (ref 0–53)
ANION GAP: 8 (ref 5–15)
AST: 20 U/L (ref 0–37)
Albumin: 3.9 g/dL (ref 3.5–5.2)
Alkaline Phosphatase: 53 U/L (ref 39–117)
BUN: 38 mg/dL — AB (ref 6–23)
CALCIUM: 9 mg/dL (ref 8.4–10.5)
CO2: 26 mmol/L (ref 19–32)
CREATININE: 2.33 mg/dL — AB (ref 0.50–1.35)
Chloride: 105 mmol/L (ref 96–112)
GFR calc Af Amer: 31 mL/min — ABNORMAL LOW (ref >90)
GFR, EST NON AFRICAN AMERICAN: 27 mL/min — AB (ref >90)
GLUCOSE: 122 mg/dL — AB (ref 70–99)
Potassium: 4.2 mmol/L (ref 3.5–5.1)
Sodium: 139 mmol/L (ref 135–145)
Total Bilirubin: 0.7 mg/dL (ref 0.3–1.2)
Total Protein: 6.9 g/dL (ref 6.0–8.3)

## 2014-09-22 LAB — I-STAT CG4 LACTIC ACID, ED: LACTIC ACID, VENOUS: 1.92 mmol/L (ref 0.5–2.0)

## 2014-09-22 LAB — CBC WITH DIFFERENTIAL/PLATELET
BASOS ABS: 0 10*3/uL (ref 0.0–0.1)
Basophils Relative: 0 % (ref 0–1)
Eosinophils Absolute: 0.2 10*3/uL (ref 0.0–0.7)
Eosinophils Relative: 2 % (ref 0–5)
HCT: 39.1 % (ref 39.0–52.0)
HEMOGLOBIN: 13 g/dL (ref 13.0–17.0)
Lymphocytes Relative: 17 % (ref 12–46)
Lymphs Abs: 1.2 10*3/uL (ref 0.7–4.0)
MCH: 31 pg (ref 26.0–34.0)
MCHC: 33.2 g/dL (ref 30.0–36.0)
MCV: 93.1 fL (ref 78.0–100.0)
MONOS PCT: 9 % (ref 3–12)
Monocytes Absolute: 0.7 10*3/uL (ref 0.1–1.0)
NEUTROS ABS: 5.2 10*3/uL (ref 1.7–7.7)
NEUTROS PCT: 72 % (ref 43–77)
Platelets: 128 10*3/uL — ABNORMAL LOW (ref 150–400)
RBC: 4.2 MIL/uL — ABNORMAL LOW (ref 4.22–5.81)
RDW: 12.3 % (ref 11.5–15.5)
WBC: 7.3 10*3/uL (ref 4.0–10.5)

## 2014-09-22 LAB — CBG MONITORING, ED: GLUCOSE-CAPILLARY: 110 mg/dL — AB (ref 70–99)

## 2014-09-22 LAB — ETHANOL: Alcohol, Ethyl (B): <5 mg/dL (ref 0–9)

## 2014-09-22 MED ORDER — SODIUM CHLORIDE 0.9 % IV SOLN
Freq: Once | INTRAVENOUS | Status: AC
  Administered 2014-09-22: 23:00:00 via INTRAVENOUS

## 2014-09-22 NOTE — ED Notes (Signed)
Bed: WA12 Expected date:  Expected time:  Means of arrival:  Comments: Ems 

## 2014-09-22 NOTE — ED Notes (Addendum)
Per PTAR , pt. From home who is reported of having hallucinations and being paranoid , pt. Himself admitted of being aware of having hallucinations which started at 6:30 this evening, non aggressive, denies SI. Pt. Is cooperative, alert and oriented x3. Pt. Was reported of being stiff upon ambulation  . Denies pain .
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
## 2014-09-22 NOTE — ED Notes (Signed)
Pt.'s family reported that pt. Started on anti deprresant medicine x1week now and noted pt. Has poor appetite since then.

## 2014-09-23 ENCOUNTER — Inpatient Hospital Stay (HOSPITAL_COMMUNITY): Payer: Medicare HMO

## 2014-09-23 DIAGNOSIS — Z7982 Long term (current) use of aspirin: Secondary | ICD-10-CM | POA: Diagnosis not present

## 2014-09-23 DIAGNOSIS — I2510 Atherosclerotic heart disease of native coronary artery without angina pectoris: Secondary | ICD-10-CM | POA: Diagnosis present

## 2014-09-23 DIAGNOSIS — E871 Hypo-osmolality and hyponatremia: Secondary | ICD-10-CM | POA: Diagnosis present

## 2014-09-23 DIAGNOSIS — Z823 Family history of stroke: Secondary | ICD-10-CM | POA: Diagnosis not present

## 2014-09-23 DIAGNOSIS — I129 Hypertensive chronic kidney disease with stage 1 through stage 4 chronic kidney disease, or unspecified chronic kidney disease: Secondary | ICD-10-CM | POA: Diagnosis present

## 2014-09-23 DIAGNOSIS — N390 Urinary tract infection, site not specified: Secondary | ICD-10-CM | POA: Diagnosis present

## 2014-09-23 DIAGNOSIS — C760 Malignant neoplasm of head, face and neck: Secondary | ICD-10-CM

## 2014-09-23 DIAGNOSIS — Z9049 Acquired absence of other specified parts of digestive tract: Secondary | ICD-10-CM | POA: Diagnosis present

## 2014-09-23 DIAGNOSIS — G9340 Encephalopathy, unspecified: Secondary | ICD-10-CM

## 2014-09-23 DIAGNOSIS — N179 Acute kidney failure, unspecified: Secondary | ICD-10-CM

## 2014-09-23 DIAGNOSIS — E1169 Type 2 diabetes mellitus with other specified complication: Secondary | ICD-10-CM

## 2014-09-23 DIAGNOSIS — N189 Chronic kidney disease, unspecified: Secondary | ICD-10-CM

## 2014-09-23 DIAGNOSIS — Z79899 Other long term (current) drug therapy: Secondary | ICD-10-CM | POA: Diagnosis not present

## 2014-09-23 DIAGNOSIS — R4182 Altered mental status, unspecified: Secondary | ICD-10-CM

## 2014-09-23 DIAGNOSIS — E1149 Type 2 diabetes mellitus with other diabetic neurological complication: Secondary | ICD-10-CM | POA: Diagnosis present

## 2014-09-23 DIAGNOSIS — E039 Hypothyroidism, unspecified: Secondary | ICD-10-CM | POA: Diagnosis present

## 2014-09-23 DIAGNOSIS — N183 Chronic kidney disease, stage 3 (moderate): Secondary | ICD-10-CM | POA: Diagnosis present

## 2014-09-23 DIAGNOSIS — Z8249 Family history of ischemic heart disease and other diseases of the circulatory system: Secondary | ICD-10-CM | POA: Diagnosis not present

## 2014-09-23 DIAGNOSIS — N3001 Acute cystitis with hematuria: Secondary | ICD-10-CM

## 2014-09-23 DIAGNOSIS — D696 Thrombocytopenia, unspecified: Secondary | ICD-10-CM | POA: Diagnosis present

## 2014-09-23 DIAGNOSIS — I252 Old myocardial infarction: Secondary | ICD-10-CM | POA: Diagnosis not present

## 2014-09-23 DIAGNOSIS — G9341 Metabolic encephalopathy: Secondary | ICD-10-CM | POA: Diagnosis present

## 2014-09-23 DIAGNOSIS — Z85038 Personal history of other malignant neoplasm of large intestine: Secondary | ICD-10-CM | POA: Diagnosis not present

## 2014-09-23 DIAGNOSIS — I959 Hypotension, unspecified: Secondary | ICD-10-CM | POA: Diagnosis present

## 2014-09-23 DIAGNOSIS — A419 Sepsis, unspecified organism: Secondary | ICD-10-CM | POA: Diagnosis present

## 2014-09-23 DIAGNOSIS — Z87891 Personal history of nicotine dependence: Secondary | ICD-10-CM | POA: Diagnosis not present

## 2014-09-23 DIAGNOSIS — R441 Visual hallucinations: Secondary | ICD-10-CM | POA: Diagnosis present

## 2014-09-23 DIAGNOSIS — C189 Malignant neoplasm of colon, unspecified: Secondary | ICD-10-CM

## 2014-09-23 DIAGNOSIS — E860 Dehydration: Secondary | ICD-10-CM | POA: Diagnosis present

## 2014-09-23 DIAGNOSIS — R410 Disorientation, unspecified: Secondary | ICD-10-CM | POA: Diagnosis present

## 2014-09-23 DIAGNOSIS — E785 Hyperlipidemia, unspecified: Secondary | ICD-10-CM | POA: Diagnosis present

## 2014-09-23 DIAGNOSIS — Z833 Family history of diabetes mellitus: Secondary | ICD-10-CM | POA: Diagnosis not present

## 2014-09-23 DIAGNOSIS — Z981 Arthrodesis status: Secondary | ICD-10-CM | POA: Diagnosis not present

## 2014-09-23 DIAGNOSIS — I9589 Other hypotension: Secondary | ICD-10-CM

## 2014-09-23 LAB — URINE MICROSCOPIC-ADD ON

## 2014-09-23 LAB — CBC
HEMATOCRIT: 34.2 % — AB (ref 39.0–52.0)
Hemoglobin: 11 g/dL — ABNORMAL LOW (ref 13.0–17.0)
MCH: 30 pg (ref 26.0–34.0)
MCHC: 32.2 g/dL (ref 30.0–36.0)
MCV: 93.2 fL (ref 78.0–100.0)
Platelets: 105 10*3/uL — ABNORMAL LOW (ref 150–400)
RBC: 3.67 MIL/uL — AB (ref 4.22–5.81)
RDW: 12.4 % (ref 11.5–15.5)
WBC: 6.2 10*3/uL (ref 4.0–10.5)

## 2014-09-23 LAB — BASIC METABOLIC PANEL
ANION GAP: 3 — AB (ref 5–15)
BUN: 37 mg/dL — ABNORMAL HIGH (ref 6–23)
CHLORIDE: 109 mmol/L (ref 96–112)
CO2: 26 mmol/L (ref 19–32)
CREATININE: 1.95 mg/dL — AB (ref 0.50–1.35)
Calcium: 7.9 mg/dL — ABNORMAL LOW (ref 8.4–10.5)
GFR, EST AFRICAN AMERICAN: 39 mL/min — AB (ref >90)
GFR, EST NON AFRICAN AMERICAN: 33 mL/min — AB (ref >90)
Glucose, Bld: 77 mg/dL (ref 70–99)
POTASSIUM: 3.9 mmol/L (ref 3.5–5.1)
SODIUM: 138 mmol/L (ref 135–145)

## 2014-09-23 LAB — URINALYSIS, ROUTINE W REFLEX MICROSCOPIC
Bilirubin Urine: NEGATIVE
Glucose, UA: NEGATIVE mg/dL
KETONES UR: NEGATIVE mg/dL
NITRITE: POSITIVE — AB
PROTEIN: 100 mg/dL — AB
SPECIFIC GRAVITY, URINE: 1.016 (ref 1.005–1.030)
Urobilinogen, UA: 0.2 mg/dL (ref 0.0–1.0)
pH: 8 (ref 5.0–8.0)

## 2014-09-23 LAB — CREATININE, URINE, RANDOM: Creatinine, Urine: 176.7 mg/dL

## 2014-09-23 LAB — RAPID URINE DRUG SCREEN, HOSP PERFORMED
AMPHETAMINES: NOT DETECTED
BARBITURATES: NOT DETECTED
Benzodiazepines: NOT DETECTED
Cocaine: NOT DETECTED
Opiates: NOT DETECTED
Tetrahydrocannabinol: NOT DETECTED

## 2014-09-23 LAB — OSMOLALITY, URINE: OSMOLALITY UR: 555 mosm/kg (ref 390–1090)

## 2014-09-23 LAB — I-STAT CG4 LACTIC ACID, ED: Lactic Acid, Venous: 0.39 mmol/L — ABNORMAL LOW (ref 0.5–2.0)

## 2014-09-23 LAB — MRSA PCR SCREENING: MRSA BY PCR: NEGATIVE

## 2014-09-23 LAB — NA AND K (SODIUM & POTASSIUM), RAND UR
POTASSIUM UR: 37 meq/L
SODIUM UR: 86 meq/L

## 2014-09-23 LAB — CK: CK TOTAL: 174 U/L (ref 7–232)

## 2014-09-23 LAB — T4, FREE: Free T4: 0.97 ng/dL (ref 0.80–1.80)

## 2014-09-23 LAB — VITAMIN B12: VITAMIN B 12: 497 pg/mL (ref 211–911)

## 2014-09-23 LAB — TSH: TSH: 3.308 u[IU]/mL (ref 0.350–4.500)

## 2014-09-23 LAB — SODIUM, URINE, RANDOM: SODIUM UR: 98 meq/L

## 2014-09-23 MED ORDER — ALUM & MAG HYDROXIDE-SIMETH 200-200-20 MG/5ML PO SUSP: 30.0000 mL | Freq: Four times a day (QID) | ORAL | Status: DC | PRN

## 2014-09-23 MED ORDER — SODIUM CHLORIDE 0.9 % IV SOLN
1000.0000 mL | Freq: Once | INTRAVENOUS | Status: AC
  Administered 2014-09-23: 1000 mL via INTRAVENOUS

## 2014-09-23 MED ORDER — ASPIRIN EC 81 MG PO TBEC
81.0000 mg | DELAYED_RELEASE_TABLET | Freq: Every day | ORAL | Status: DC
  Administered 2014-09-23 – 2014-09-28 (×6): 81 mg via ORAL
  Filled 2014-09-23 (×6): qty 1

## 2014-09-23 MED ORDER — CEFEPIME HCL 2 G IJ SOLR: 2.0000 g | Freq: Two times a day (BID) | INTRAMUSCULAR | Status: DC

## 2014-09-23 MED ORDER — VANCOMYCIN HCL 10 G IV SOLR
1250.0000 mg | Freq: Every day | INTRAVENOUS | Status: DC
  Administered 2014-09-23: 1250 mg via INTRAVENOUS
  Filled 2014-09-23: qty 1250

## 2014-09-23 MED ORDER — ONDANSETRON HCL 4 MG/2ML IJ SOLN: 4.0000 mg | Freq: Four times a day (QID) | INTRAMUSCULAR | Status: DC | PRN

## 2014-09-23 MED ORDER — SIMVASTATIN 40 MG PO TABS
40.0000 mg | ORAL_TABLET | Freq: Every day | ORAL | Status: DC
  Administered 2014-09-25 – 2014-09-27 (×3): 40 mg via ORAL
  Filled 2014-09-23 (×4): qty 1

## 2014-09-23 MED ORDER — LORAZEPAM 2 MG/ML IJ SOLN
0.5000 mg | Freq: Four times a day (QID) | INTRAMUSCULAR | Status: DC | PRN
  Administered 2014-09-23 – 2014-09-24 (×3): 0.5 mg via INTRAVENOUS
  Filled 2014-09-23 (×2): qty 1

## 2014-09-23 MED ORDER — HYDROMORPHONE HCL 1 MG/ML IJ SOLN
0.5000 mg | INTRAMUSCULAR | Status: DC | PRN
  Administered 2014-09-23: 1 mg via INTRAVENOUS
  Administered 2014-09-24: 0.5 mg via INTRAVENOUS
  Filled 2014-09-23 (×2): qty 1

## 2014-09-23 MED ORDER — TAMSULOSIN HCL 0.4 MG PO CAPS
0.4000 mg | ORAL_CAPSULE | Freq: Every day | ORAL | Status: DC
  Administered 2014-09-23 – 2014-09-28 (×6): 0.4 mg via ORAL
  Filled 2014-09-23 (×6): qty 1

## 2014-09-23 MED ORDER — SODIUM CHLORIDE 0.9 % IV BOLUS (SEPSIS)
1000.0000 mL | Freq: Once | INTRAVENOUS | Status: AC
  Administered 2014-09-23: 1000 mL via INTRAVENOUS

## 2014-09-23 MED ORDER — LORAZEPAM 2 MG/ML IJ SOLN
1.0000 mg | Freq: Once | INTRAMUSCULAR | Status: DC
  Filled 2014-09-23: qty 1

## 2014-09-23 MED ORDER — OXYCODONE HCL 5 MG PO TABS: 5.0000 mg | ORAL_TABLET | ORAL | Status: DC | PRN

## 2014-09-23 MED ORDER — HEPARIN SODIUM (PORCINE) 5000 UNIT/ML IJ SOLN
5000.0000 [IU] | Freq: Three times a day (TID) | INTRAMUSCULAR | Status: DC
  Administered 2014-09-23 – 2014-09-28 (×15): 5000 [IU] via SUBCUTANEOUS
  Filled 2014-09-23 (×16): qty 1

## 2014-09-23 MED ORDER — VANCOMYCIN HCL IN DEXTROSE 1-5 GM/200ML-% IV SOLN
1000.0000 mg | Freq: Once | INTRAVENOUS | Status: AC
  Administered 2014-09-23: 1000 mg via INTRAVENOUS
  Filled 2014-09-23: qty 200

## 2014-09-23 MED ORDER — ACETAMINOPHEN 325 MG PO TABS
650.0000 mg | ORAL_TABLET | Freq: Four times a day (QID) | ORAL | Status: DC | PRN
  Filled 2014-09-23: qty 2

## 2014-09-23 MED ORDER — ACETAMINOPHEN 650 MG RE SUPP: 650.0000 mg | Freq: Four times a day (QID) | RECTAL | Status: DC | PRN

## 2014-09-23 MED ORDER — SODIUM CHLORIDE 0.9 % IV SOLN
1000.0000 mL | INTRAVENOUS | Status: DC
  Administered 2014-09-23: 1000 mL via INTRAVENOUS

## 2014-09-23 MED ORDER — CEFEPIME HCL 2 G IJ SOLR
2.0000 g | Freq: Every day | INTRAMUSCULAR | Status: DC
  Administered 2014-09-23 (×2): 2 g via INTRAVENOUS
  Filled 2014-09-23 (×2): qty 2

## 2014-09-23 MED ORDER — DEXTROSE 5 % IV SOLN
1.0000 g | Freq: Once | INTRAVENOUS | Status: AC
  Administered 2014-09-23: 1 g via INTRAVENOUS
  Filled 2014-09-23: qty 10

## 2014-09-23 MED ORDER — POLYETHYLENE GLYCOL 3350 17 G PO PACK: 17.0000 g | PACK | Freq: Every day | ORAL | Status: DC | PRN

## 2014-09-23 MED ORDER — SODIUM CHLORIDE 0.9 % IV SOLN
INTRAVENOUS | Status: DC
Start: 2014-09-23 — End: 2014-09-28
  Administered 2014-09-23 – 2014-09-28 (×6): via INTRAVENOUS

## 2014-09-23 MED ORDER — ONDANSETRON HCL 4 MG PO TABS: 4.0000 mg | ORAL_TABLET | Freq: Four times a day (QID) | ORAL | Status: DC | PRN
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
## 2014-09-23 NOTE — Progress Notes (Signed)
PROGRESS NOTE  Michael Holder JJH:[id number] DOB: 04-13-1946 DOA: 09/22/2014 PCP: Owens Loffler, MD  Brief history 69 year old male presented to the emergency department with 4-5 day history of increasing paranoid thoughts and confusion. Family states that over the past 4-5 months, the patient has made a progressive decline with increasing confusion. His primary care physician is currently working up his mental status changes for dementia. More specifically, the patient was started on Celexa one week ago, and since that time, the family has noted increasing hallucinations and confusion. The patient's wife also states that he has had poor oral intake. There's been no fevers, chest pain, shortness breath, vomiting, diarrhea. At baseline, the patient is able to perform all his activities of daily living without any assistance. 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) -Renal ultrasound -Continue IV fluids -urine creatinine -urine sodium Hyponatremia -Secondary to volume depletion -Continue IV fluids Diabetes mellitus type 2 -Hemoglobin A1c -NovoLog sliding scale Thrombocytopenia -Appears to be chronic dating back to July 2014 -Serum B12, RBC folate -Abdominal ultrasound to evaluate hepatic architecture and rule out splenomegaly Hyperlipidemia  -Plan to restart statin if CK is okay   Family Communication:    Wife updated on phone 09/23/14 Disposition Plan:   Home when medically stable      Procedures/Studies: Dg  Lumbar Spine Complete  09/11/2014   CLINICAL DATA:  Upper lumbar back pain after following this morning, midline low back pain without sciatica  EXAM: LUMBAR SPINE - COMPLETE 4+ VIEW  COMPARISON:  CT abdomen and pelvis 02/18/2013  FINDINGS: Hypoplastic last rib pair.  Five additional non rib-bearing lumbar type vertebrae.  Vertebral body heights maintained without fracture or subluxation.  No spondylolysis.  Scattered atherosclerotic calcifications.  Prior ORIF of the pubis.  Partial ankylosis of the SI joints.  IMPRESSION: No acute lumbar spine abnormalities   Electronically Signed   By: Lavonia Dana M.D.   On: 09/11/2014 17:57   Ct Head Wo Contrast  09/23/2014   CLINICAL DATA:  Acute onset of altered mental status and confusion. Hallucinations. Initial encounter.  EXAM: CT HEAD WITHOUT CONTRAST  TECHNIQUE: Contiguous axial images were obtained from the base of the skull through the vertex without intravenous contrast.  COMPARISON:  CT of the head performed 06/29/2006  FINDINGS: There is no evidence of acute infarction, mass lesion, or intra- or extra-axial hemorrhage on CT.  Prominence of the ventricles and sulci reflects mild cortical volume loss. Mild periventricular white matter change likely reflects small vessel ischemic microangiopathy.  The brainstem and fourth ventricle are within normal limits. The basal ganglia are unremarkable in appearance. The cerebral hemispheres demonstrate grossly normal gray-white differentiation. No mass effect or midline shift is seen.  There is no evidence of fracture; visualized osseous structures are unremarkable in appearance. The orbits are within normal limits. The paranasal sinuses and mastoid air cells are well-aerated. No significant soft tissue abnormalities are seen.  IMPRESSION: 1. No acute intracranial pathology seen on CT. 2. Mild cortical volume loss and scattered small vessel ischemic microangiopathy.   Electronically Signed   By: Francoise Schaumann.D.  On:  09/23/2014 04:43        Subjective: Patient denies fevers, chills, headache, chest pain, dyspnea, nausea, vomiting, diarrhea, abdominal pain, dysuria, hematuria   Objective: Filed Vitals:   09/23/14 0011 09/23/14 0115 09/23/14 0158 09/23/14 0400  BP: 95/40 86/48 123/61 107/51  Pulse: 65 60 66 66  Temp:   97.4 F (36.3 C)   TempSrc:   Oral   Resp: 17  17 19   SpO2: 96% 97% 96% 95%    Intake/Output Summary (Last 24 hours) at 09/23/14 1008 Last data filed at 09/23/14 0700  Gross per 24 hour  Intake   1550 ml  Output    275 ml  Net   1275 ml   Weight change:  Exam:   General:  Pt is alert, follows commands appropriately, not in acute distress  HEENT: No icterus, No thrush,  [place]/AT  Cardiovascular: RRR, S1/S2, no rubs, no gallops  Respiratory: CTA bilaterally, no wheezing, no crackles, no rhonchi  Abdomen: Soft/+BS, non tender, non distended, no guarding  Extremities: No edema, No lymphangitis, No petechiae, No rashes, no synovitis  Data Reviewed: Basic Metabolic Panel:  Recent Labs Lab 09/22/14 2236 09/23/14 0418  NA 139 138  K 4.2 3.9  CL 105 109  CO2 26 26  GLUCOSE 122* 77  BUN 38* 37*  CREATININE 2.33* 1.95*  CALCIUM 9.0 7.9*   Liver Function Tests:  Recent Labs Lab 09/22/14 2236  AST 20  ALT 16  ALKPHOS 53  BILITOT 0.7  PROT 6.9  ALBUMIN 3.9   No results for input(s): LIPASE, AMYLASE in the last 168 hours. No results for input(s): AMMONIA in the last 168 hours. CBC:  Recent Labs Lab 09/22/14 2236 09/23/14 0418  WBC 7.3 6.2  NEUTROABS 5.2  --   HGB 13.0 11.0*  HCT 39.1 34.2*  MCV 93.1 93.2  PLT 128* 105*   Cardiac Enzymes: No results for input(s): CKTOTAL, CKMB, CKMBINDEX, TROPONINI in the last 168 hours. BNP: Invalid input(s): POCBNP CBG:  Recent Labs Lab 09/22/14 2240  GLUCAP 110*    Recent Results (from the past 240 hour(s))  MRSA PCR Screening     Status: None   Collection Time: 09/23/14  3:28 AM  Result Value  Ref Range Status   MRSA by PCR NEGATIVE NEGATIVE Final    Comment:        The GeneXpert MRSA Assay (FDA approved for NASAL specimens only), is one component of a comprehensive MRSA colonization surveillance program. It is not intended to diagnose MRSA infection nor to guide or monitor treatment for MRSA infections.      Scheduled Meds: . aspirin EC  81 mg Oral Daily  . ceFEPime (MAXIPIME) IV  2 g Intravenous QHS  . heparin  5,000 Units Subcutaneous 3 times per day  . simvastatin  40 mg Oral QHS  . tamsulosin  0.4 mg Oral QPC breakfast  . vancomycin  1,250 mg Intravenous QHS   Continuous Infusions: . sodium chloride 1,000 mL (09/23/14 0154)  . sodium chloride 75 mL/hr at 09/23/14 0358     Wellington Winegarden, DO  [facility] Pager [phone]  If 7PM-7AM, please contact night-coverage [email or web address] Password [email or web address] 09/23/2014, 10:08 AM   LOS: 0 days
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
## 2014-09-24 DIAGNOSIS — N189 Chronic kidney disease, unspecified: Secondary | ICD-10-CM

## 2014-09-24 DIAGNOSIS — N390 Urinary tract infection, site not specified: Secondary | ICD-10-CM

## 2014-09-24 LAB — URINE CULTURE: Colony Count: >=100000

## 2014-09-24 LAB — BASIC METABOLIC PANEL
Anion gap: 3 — ABNORMAL LOW (ref 5–15)
BUN: 22 mg/dL (ref 6–23)
CALCIUM: 8.2 mg/dL — AB (ref 8.4–10.5)
CO2: 25 mmol/L (ref 19–32)
CREATININE: 1.3 mg/dL (ref 0.50–1.35)
Chloride: 111 mmol/L (ref 96–112)
GFR calc Af Amer: 63 mL/min — ABNORMAL LOW (ref >90)
GFR calc non Af Amer: 54 mL/min — ABNORMAL LOW (ref >90)
Glucose, Bld: 80 mg/dL (ref 70–99)
POTASSIUM: 3.9 mmol/L (ref 3.5–5.1)
Sodium: 139 mmol/L (ref 135–145)

## 2014-09-24 LAB — CBC
HCT: 37.4 % — ABNORMAL LOW (ref 39.0–52.0)
HEMOGLOBIN: 12.4 g/dL — AB (ref 13.0–17.0)
MCH: 30.5 pg (ref 26.0–34.0)
MCHC: 33.2 g/dL (ref 30.0–36.0)
MCV: 91.9 fL (ref 78.0–100.0)
Platelets: 95 10*3/uL — ABNORMAL LOW (ref 150–400)
RBC: 4.07 MIL/uL — AB (ref 4.22–5.81)
RDW: 12.1 % (ref 11.5–15.5)
WBC: 7.2 10*3/uL (ref 4.0–10.5)

## 2014-09-24 MED ORDER — HALOPERIDOL LACTATE 5 MG/ML IJ SOLN
5.0000 mg | Freq: Four times a day (QID) | INTRAMUSCULAR | Status: DC | PRN
  Administered 2014-09-24 – 2014-09-25 (×2): 5 mg via INTRAVENOUS
  Filled 2014-09-24 (×2): qty 1

## 2014-09-24 MED ORDER — DEXTROSE 5 % IV SOLN
2.0000 g | Freq: Two times a day (BID) | INTRAVENOUS | Status: DC
  Administered 2014-09-24 – 2014-09-26 (×4): 2 g via INTRAVENOUS
  Filled 2014-09-24 (×5): qty 2

## 2014-09-24 MED ORDER — VANCOMYCIN HCL IN DEXTROSE 750-5 MG/150ML-% IV SOLN
750.0000 mg | Freq: Two times a day (BID) | INTRAVENOUS | Status: DC
  Administered 2014-09-24 – 2014-09-25 (×2): 750 mg via INTRAVENOUS
  Filled 2014-09-24 (×4): qty 150
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
## 2014-09-24 NOTE — Progress Notes (Signed)
CRITICAL VALUE ALERT  Critical value received:  Positive blood cultures  Date of notification:  10/04/14  Time of notification:  7588  Critical value read back:Yes.    Nurse who received alert:  Pearla Dubonnet RN  MD notified (1st page): Tat  Time of first page:  1640  MD notified (2nd page):   Time of second page:  Responding MD:    Time MD responded:

## 2014-09-24 NOTE — Progress Notes (Signed)
Received patient as transfer from ICU RN Maudie Mercury.  Patient stable and oriented x4 at time of transfer.  Telemetry placed on patient and verified with CMT.  Bed alarm on.  Will continue to monitor.

## 2014-09-24 NOTE — Progress Notes (Signed)
At change of shift, around 2000, patient became progressively more irritated and aggressive.  Pt was oriented to self, place and time and was purposeful, but he would yell out, "help, call the police", "there is someone in the closet!", and other inappropriate statements. He was pulling at lines and kicking his legs at the side of the bed and at staff.  BP and heart rate were elevated.  Sats were 94% on room air and patient refused any oxygen, fluids or help.  Pt says he has no history of drinking or drug use.  CT of head had been done in the am.  Pt medicated per orders and has been resting quietly. Will continue to monitor.

## 2014-09-24 NOTE — Progress Notes (Signed)
PROGRESS NOTE  JAQUISE FAUX AOZ:[id number] DOB: 1945-09-27 DOA: 09/22/2014 PCP: Owens Loffler, MD  Brief history 69 year old male presented to the emergency department with 4-5 day history of increasing paranoid thoughts and confusion. Family states that over the past 4-5 months, the patient has made a progressive decline with increasing confusion. His primary care physician is currently working up his mental status changes for dementia. More specifically, the patient was started on Celexa one week ago, and since that time, the family has noted increasing hallucinations and confusion. The patient's wife also states that he has had poor oral intake. There's been no fevers, chest pain, shortness breath, vomiting, diarrhea. At baseline, the patient is able to perform all his activities of daily living without any assistance. 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) -improving with IVF -Renal ultrasound--no hydronephrosis -Continue IV fluids -FeNa--0.79% -CK 174 Hyponatremia -Secondary to volume depletion -Continue IV fluids--improved Diabetes mellitus type 2 -Hemoglobin A1c--pending -NovoLog sliding scale Thrombocytopenia -Appears to be chronic dating back to July 2014 -Serum B12--497 -RBC folate--pending -hepatic architecture--WNL , no splenomegaly on abdominal US Hyperlipidemia  -restart  statin  Family Communication: Wife updated on phone 09/23/14 Disposition Plan: Home  1-2 days      Procedures/Studies: Dg Lumbar Spine Complete  09/11/2014   CLINICAL DATA:  Upper lumbar back pain after following this morning, midline low back pain without sciatica  EXAM: LUMBAR SPINE - COMPLETE 4+ VIEW  COMPARISON:  CT abdomen and pelvis 02/18/2013  FINDINGS: Hypoplastic last rib pair.  Five additional non rib-bearing lumbar type vertebrae.  Vertebral body heights maintained without fracture or subluxation.  No spondylolysis.  Scattered atherosclerotic calcifications.  Prior ORIF of the pubis.  Partial ankylosis of the SI joints.  IMPRESSION: No acute lumbar spine abnormalities   Electronically Signed   By: Lavonia Dana M.D.   On: 09/11/2014 17:57   Ct Head Wo Contrast  09/23/2014   CLINICAL DATA:  Acute onset of altered mental status and confusion. Hallucinations. Initial encounter.  EXAM: CT HEAD WITHOUT CONTRAST  TECHNIQUE: Contiguous axial images were obtained from the base of the skull through the vertex without intravenous contrast.  COMPARISON:  CT of the head performed 06/29/2006  FINDINGS: There is no evidence of acute infarction, mass lesion, or intra- or extra-axial hemorrhage on CT.  Prominence of the ventricles and sulci reflects mild cortical volume loss. Mild periventricular white matter change likely reflects small vessel ischemic microangiopathy.  The brainstem and fourth ventricle are within normal limits. The basal ganglia are unremarkable in appearance. The cerebral hemispheres demonstrate grossly normal gray-white differentiation. No mass effect or midline shift is seen.  There is no evidence of fracture; visualized osseous structures are unremarkable in appearance. The orbits are within normal limits. The paranasal sinuses and mastoid air cells are well-aerated. No significant soft tissue abnormalities are seen.  IMPRESSION: 1. No acute intracranial pathology seen on CT. 2. Mild  cortical volume loss and scattered small vessel ischemic microangiopathy.   Electronically Signed   By: Jacqulynn Cadet  Chang M.D.   On: 09/23/2014 04:43   US Abdomen Complete  09/23/2014   CLINICAL DATA:  Acute on chronic renal failure. Thrombocytopenia. Colon carcinoma.  EXAM: ULTRASOUND ABDOMEN COMPLETE  COMPARISON:  CT on 02/18/2013  FINDINGS: Gallbladder: Several small less than 1 cm gallstones are seen in the gallbladder neck. No evidence of gallbladder dilatation or wall thickening. No sonographic Murphy sign noted by sonographer.  Common bile duct: Diameter: 6 mm, which is within normal limits.  Liver: No focal lesion identified. Within normal limits i no evidence of splenomegaly. N parenchymal echogenicity.  IVC: No abnormality visualized.  Pancreas: Not well visualized due to patient habitus and bowel gas.  Spleen: Size and appearance within normal limits.  Right Kidney: Length: 9.8 cm. Echogenicity within normal limits. Small approximately 1.4 cm subcapsular cyst noted in the upper pole as better visualized on recent CT. No mass or hydronephrosis visualized.  Left Kidney: Length: 10.5 cm. Echogenicity within normal limits. No mass or hydronephrosis visualized.  Abdominal aorta: No aneurysm visualized.  Other findings: None.  IMPRESSION: Small less than 1 cm gallstones. No sonographic signs of acute cholecystitis or biliary dilatation.  Both kidneys are within normal limits in size and echogenicity. No evidence of hydronephrosis.   Electronically Signed   By: Earle Gell M.D.   On: 09/23/2014 14:12   Dg Chest Port 1 View  09/23/2014   CLINICAL DATA:  Confusion, weakness, hypotension, diabetes and UTI. Initial encounter.  EXAM: PORTABLE CHEST - 1 VIEW  COMPARISON:  08/08/2011 and prior chest radiographs.  FINDINGS: Upper limits normal heart size again noted.  Mild peribronchial thickening again identified.  Mild right basilar atelectasis is present.  There is no evidence of focal airspace disease, pulmonary  edema, suspicious pulmonary nodule/mass, pleural effusion, or pneumothorax. No acute bony abnormalities are identified.  IMPRESSION: Mild right basilar atelectasis without other acute abnormality.  Chronic peribronchial thickening and elevation of the right hemidiaphragm.   Electronically Signed   By: Margarette Canada M.D.   On: 09/23/2014 13:20         Subjective: Patient is more alert, but still having intermittent hallucinations. Denies fevers, chills, chest pain, short of breath, nausea, vomiting, diarrhea, abdominal pain. No dysuria.  Objective: Filed Vitals:   09/24/14 0400 09/24/14 0600 09/24/14 0700 09/24/14 0800  BP: 96/70 102/82    Pulse: 57 56    Temp: 97.6 F (36.4 C)   97.6 F (36.4 C)  TempSrc: Oral   Axillary  Resp: 10 11    Height:   5\' 9"  (1.753 m)   Weight:   86.8 kg (191 lb 5.8 oz)   SpO2: 97% 99%      Intake/Output Summary (Last 24 hours) at 09/24/14 1031 Last data filed at 09/24/14 0500  Gross per 24 hour  Intake   1350 ml  Output   1045 ml  Net    305 ml   Weight change:  Exam:   General:  Pt is alert, follows commands appropriately, not in acute distress  HEENT: No icterus, No thrush,  /AT  Cardiovascular: RRR, S1/S2, no rubs, no gallops  Respiratory: Poor respiratory effort. Bibasilar crackles. No wheeze.  Abdomen: Soft/+BS, non tender, non distended, no guarding  Extremities: No edema, No lymphangitis, No petechiae, No rashes, no synovitis  Data Reviewed: Basic Metabolic Panel:  Recent Labs Lab 09/22/14 2236 09/23/14 0418 09/24/14 0357  NA 139 138 139  K 4.2 3.9 3.9  CL 105 109 111  CO2 26 26 25  GLUCOSE 122* 77 80  BUN 38* 37* 22  CREATININE 2.33* 1.95* 1.30  CALCIUM 9.0 7.9* 8.2*   Liver Function Tests:  Recent Labs Lab 09/22/14 2236  AST 20  ALT 16  ALKPHOS 53  BILITOT 0.7  PROT 6.9  ALBUMIN 3.9   No results for input(s): LIPASE, AMYLASE in the last 168 hours. No results for input(s): AMMONIA in the last 168  hours. CBC:  Recent Labs Lab 09/22/14 2236 09/23/14 0418 09/24/14 0357  WBC 7.3 6.2 7.2  NEUTROABS 5.2  --   --   HGB 13.0 11.0* 12.4*  HCT 39.1 34.2* 37.4*  MCV 93.1 93.2 91.9  PLT 128* 105* 95*   Cardiac Enzymes:  Recent Labs Lab 09/23/14 1100  CKTOTAL 174   BNP: Invalid input(s): POCBNP CBG:  Recent Labs Lab 09/22/14 2240  GLUCAP 110*    Recent Results (from the past 240 hour(s))  Urine culture     Status: None   Collection Time: 09/23/14 12:40 AM  Result Value Ref Range Status   Specimen Description URINE, RANDOM  Final   Special Requests NONE  Final   Colony Count   Final    >=100,000 COLONIES/ML Performed at [facility]    Culture   Final    Multiple bacterial morphotypes present, none predominant. Suggest appropriate recollection if clinically indicated. Performed at [facility]    Report Status 09/24/2014 FINAL  Final  Culture, blood (routine x 2)     Status: None (Preliminary result)   Collection Time: 09/23/14  3:02 AM  Result Value Ref Range Status   Specimen Description BLOOD LEFT ANTECUBITAL  Final   Special Requests BOTTLES DRAWN AEROBIC AND ANAEROBIC 5ML  Final   Culture   Final           BLOOD CULTURE RECEIVED NO GROWTH TO DATE CULTURE WILL BE HELD FOR 5 DAYS BEFORE ISSUING A FINAL NEGATIVE REPORT Performed at [facility]    Report Status PENDING  Incomplete  Culture, blood (routine x 2)     Status: None (Preliminary result)   Collection Time: 09/23/14  3:02 AM  Result Value Ref Range Status   Specimen Description BLOOD RIGHT ANTECUBITAL  Final   Special Requests BOTTLES DRAWN AEROBIC AND ANAEROBIC 5ML  Final   Culture   Final           BLOOD CULTURE RECEIVED NO GROWTH TO DATE CULTURE WILL BE HELD FOR 5 DAYS BEFORE ISSUING A FINAL NEGATIVE REPORT Performed at [facility]    Report Status PENDING  Incomplete  MRSA PCR Screening     Status: None   Collection Time: 09/23/14  3:28 AM  Result Value Ref  Range Status   MRSA by PCR NEGATIVE NEGATIVE Final    Comment:        The GeneXpert MRSA Assay (FDA approved for NASAL specimens only), is one component of a comprehensive MRSA colonization surveillance program. It is not intended to diagnose MRSA infection nor to guide or monitor treatment for MRSA infections.      Scheduled Meds: . aspirin EC  81 mg Oral Daily  . ceFEPime (MAXIPIME) IV  2 g Intravenous QHS  . heparin  5,000 Units Subcutaneous 3 times per day  . LORazepam  1 mg Intravenous Once  . simvastatin  40 mg Oral QHS  . tamsulosin  0.4 mg Oral QPC breakfast  . vancomycin  1,250 mg Intravenous QHS   Continuous Infusions: . sodium chloride 1,000  mL (09/23/14 0154)  . sodium chloride 75 mL/hr at 09/24/14 0552     Dearius Hoffmann, DO  [facility] Pager [phone]  If 7PM-7AM, please contact night-coverage [email or web address] Password [email or web address] 09/24/2014, 10:31 AM   LOS: 1 day

## 2014-09-25 ENCOUNTER — Telehealth: Payer: Self-pay

## 2014-09-25 LAB — BASIC METABOLIC PANEL
Anion gap: 7 (ref 5–15)
BUN: 12 mg/dL (ref 6–23)
CO2: 24 mmol/L (ref 19–32)
Calcium: 8.2 mg/dL — ABNORMAL LOW (ref 8.4–10.5)
Chloride: 105 mmol/L (ref 96–112)
Creatinine, Ser: 1.12 mg/dL (ref 0.50–1.35)
GFR calc non Af Amer: 65 mL/min — ABNORMAL LOW (ref >90)
GFR, EST AFRICAN AMERICAN: 76 mL/min — AB (ref >90)
GLUCOSE: 109 mg/dL — AB (ref 70–99)
POTASSIUM: 3.7 mmol/L (ref 3.5–5.1)
SODIUM: 136 mmol/L (ref 135–145)

## 2014-09-25 LAB — CBC
HCT: 39.8 % (ref 39.0–52.0)
Hemoglobin: 13.2 g/dL (ref 13.0–17.0)
MCH: 29.7 pg (ref 26.0–34.0)
MCHC: 33.2 g/dL (ref 30.0–36.0)
MCV: 89.6 fL (ref 78.0–100.0)
PLATELETS: 92 10*3/uL — AB (ref 150–400)
RBC: 4.44 MIL/uL (ref 4.22–5.81)
RDW: 11.8 % (ref 11.5–15.5)
WBC: 8.1 10*3/uL (ref 4.0–10.5)

## 2014-09-25 LAB — AMMONIA: AMMONIA: 15 umol/L (ref 11–32)

## 2014-09-25 LAB — RPR: RPR: NONREACTIVE

## 2014-09-25 LAB — FOLATE RBC
FOLATE, RBC: 1175 ng/mL (ref >498)
Folate, Hemolysate: 446.5 ng/mL
HEMATOCRIT: 38 % (ref 37.5–51.0)

## 2014-09-25 LAB — HEMOGLOBIN A1C
HEMOGLOBIN A1C: 5.7 % — AB (ref 4.8–5.6)
Mean Plasma Glucose: 117 mg/dL

## 2014-09-25 MED ORDER — VANCOMYCIN HCL IN DEXTROSE 1-5 GM/200ML-% IV SOLN
1000.0000 mg | Freq: Two times a day (BID) | INTRAVENOUS | Status: DC
  Administered 2014-09-25 – 2014-09-27 (×4): 1000 mg via INTRAVENOUS
  Filled 2014-09-25 (×4): qty 200

## 2014-09-25 NOTE — Progress Notes (Signed)
ANTIBIOTIC CONSULT NOTE - follow-up  Pharmacy Consult for vancomycin Indication: UTI, Sepsis  No Known Allergies  Patient Measurements: Height: 5\' 9"  (175.3 cm) Weight: 191 lb 5.8 oz (86.8 kg) IBW/kg (Calculated) : 70.7 Adjusted Body Weight:   Vital Signs: Temp: 97.7 F (36.5 C) (03/07 0608) Temp Source: Axillary (03/07 0608) BP: 108/90 mmHg (03/07 0608) Pulse Rate: 92 (03/07 0608) Intake/Output from previous day: 03/06 0701 - 03/07 0700 In: 1250 [I.V.:1000; IV Piggyback:250] Out: 3075 [Urine:3075] Intake/Output from this shift:    Labs:  Recent Labs  09/23/14 0040 09/23/14 0418 09/24/14 0357 09/25/14 0445  WBC  --  6.2 7.2 8.1  HGB  --  11.0* 12.4* 13.2  PLT  --  105* 95* 92*  LABCREA 176.7  --   --   --   CREATININE  --  1.95* 1.30 1.12   Estimated Creatinine Clearance: 67.9 mL/min (by C-G formula based on Cr of 1.12). No results for input(s): VANCOTROUGH, VANCOPEAK, VANCORANDOM, GENTTROUGH, GENTPEAK, GENTRANDOM, TOBRATROUGH, TOBRAPEAK, TOBRARND, AMIKACINPEAK, AMIKACINTROU, AMIKACIN in the last 72 hours.   Microbiology: Recent Results (from the past 720 hour(s))  Urine culture     Status: None   Collection Time: 09/23/14 12:40 AM  Result Value Ref Range Status   Specimen Description URINE, RANDOM  Final   Special Requests NONE  Final   Colony Count   Final    >=100,000 COLONIES/ML Performed at [facility]    Culture   Final    Multiple bacterial morphotypes present, none predominant. Suggest appropriate recollection if clinically indicated. Performed at [facility]    Report Status 09/24/2014 FINAL  Final  Culture, blood (routine x 2)     Status: None (Preliminary result)   Collection Time: 09/23/14  3:02 AM  Result Value Ref Range Status   Specimen Description BLOOD LEFT ANTECUBITAL  Final   Special Requests BOTTLES DRAWN AEROBIC AND ANAEROBIC 5ML  Final   Culture   Final           BLOOD CULTURE RECEIVED NO GROWTH TO DATE CULTURE  WILL BE HELD FOR 5 DAYS BEFORE ISSUING A FINAL NEGATIVE REPORT Performed at [facility]    Report Status PENDING  Incomplete  Culture, blood (routine x 2)     Status: None (Preliminary result)   Collection Time: 09/23/14  3:02 AM  Result Value Ref Range Status   Specimen Description BLOOD RIGHT ANTECUBITAL  Final   Special Requests BOTTLES DRAWN AEROBIC AND ANAEROBIC 5ML  Final   Culture   Final    GRAM POSITIVE COCCI IN CLUSTERS Note: CRITICAL RESULT CALLED TO, READ BACK BY AND VERIFIED WITH: KAMNA O ON 3.6.16 @ 1640 BY [place] Performed at [facility]    Report Status PENDING  Incomplete  MRSA PCR Screening     Status: None   Collection Time: 09/23/14  3:28 AM  Result Value Ref Range Status   MRSA by PCR NEGATIVE NEGATIVE Final    Comment:        The GeneXpert MRSA Assay (FDA approved for NASAL specimens only), is one component of a comprehensive MRSA colonization surveillance program. It is not intended to diagnose MRSA infection nor to guide or monitor treatment for MRSA infections.      Medications:  Anti-infectives    Start     Dose/Rate Route Frequency Ordered Stop   09/24/14 1800  vancomycin (VANCOCIN) IVPB 750 mg/150 ml premix     750 mg 150 mL/hr over 60 Minutes  Intravenous Every 12 hours 09/24/14 1239     09/24/14 1400  ceFEPIme (MAXIPIME) 2 g in dextrose 5 % 50 mL IVPB     2 g 100 mL/hr over 30 Minutes Intravenous Every 12 hours 09/24/14 1239     09/23/14 2200  vancomycin (VANCOCIN) 1,250 mg in sodium chloride 0.9 % 250 mL IVPB  Status:  Discontinued     1,250 mg 166.7 mL/hr over 90 Minutes Intravenous Daily at bedtime 09/23/14 0513 09/24/14 1239   09/23/14 1000  ceFEPIme (MAXIPIME) 2 g in dextrose 5 % 50 mL IVPB  Status:  Discontinued     2 g 100 mL/hr over 30 Minutes Intravenous Every 12 hours 09/23/14 0250 09/23/14 0330   09/23/14 0345  ceFEPIme (MAXIPIME) 2 g in dextrose 5 % 50 mL IVPB  Status:  Discontinued     2 g 100 mL/hr over 30  Minutes Intravenous Daily at bedtime 09/23/14 0331 09/24/14 1239   09/23/14 0300  vancomycin (VANCOCIN) IVPB 1000 mg/200 mL premix     1,000 mg 200 mL/hr over 60 Minutes Intravenous  Once 09/23/14 0254 09/23/14 0502   09/23/14 0045  cefTRIAXone (ROCEPHIN) 1 g in dextrose 5 % 50 mL IVPB     1 g 100 mL/hr over 30 Minutes Intravenous  Once 09/23/14 0040 09/23/14 0154     Assessment: HPI: sepsis 69 y.o. 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 Patient with UTI/sepsis   09/23/2014 >> vanc >> 09/23/2014 >> cefepime (MD) >>  Tmax: afebrile WBCs: WNL Renal: SCr improved to WNL. Norm CrCl 63  3/5 bloodx2: GPC clusters 1/2 3/5 urine: >100k multiple none predominant 3/7 RPR: 3/7 HIV ab:   Goal of Therapy:  Vancomycin trough level 15-20 mcg/ml  Plan:  Day #3 vancomycin/cefepime  Due to improved Scr, will change vancomycin from 750mg  q12 ato 1gm IV q12h  Cefepime dose remains appropriate  Await final blood culture result  Check vancomycin trough if vancomycin to continue > additional 48h  Follow renal function  Doreene Eland, PharmD, BCPS.   Pager: [phone] 09/25/2014 12:23 PM

## 2014-09-25 NOTE — Progress Notes (Signed)
PROGRESS NOTE  Michael Holder LKT:[id number] DOB: [birth year] DOA: 09/22/2014 PCP: Owens Loffler, MD  Brief history 69 year old male presented to the emergency department with 4-5 day history of increasing paranoid thoughts and confusion. Family states that over the past 4-5 months, the patient has made a progressive decline with increasing confusion. His primary care physician is currently working up his mental status changes for dementia. More specifically, the patient was started on Celexa one week ago, and since that time, the family has noted increasing hallucinations and confusion. The patient's wife also states that he has had poor oral intake. There's been no fevers, chest pain, shortness breath, vomiting, diarrhea. At baseline, the patient is able to perform all his activities of daily living without any assistance. Assessment/Plan: Sepsis -Present at the time of admission -Patient presented with hypotension, acute encephalopathy, with urinary source of infection -Continue intravenous antibiotics pending culture data -continue IV fluids Acute encephalopathy -Secondary to dehydration, acute kidney injury, UTI, and recent start of Celexa -Slight improvement from the day of admission -Suspect the patient has underlying dementia -TSH--3.30/free T4--0.97 -d/c Celexa -check ammonia--15 -RPR pending -Serum B12--497 UTI -Continue empiric antibiotics given pt's clinical presentation -urine culture--multiple organisms Bacteremia -GPC in one of 2 sets -continue vancomycin pending identification 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) -improving with IVF -Renal ultrasound--no hydronephrosis -Continue IV fluids -FeNa--0.79% -CK 174 -Will not plan to restart lisinopril after discharge given risk for developing renal failure and hyperkalemia  Hyponatremia -Secondary to volume depletion -Continue IV fluids--improved Diabetes mellitus type 2 -Hemoglobin  A1c--5.7 -NovoLog sliding scale -Plan to restart glipizide after discharge Thrombocytopenia -Appears to be chronic dating back to July 2014 -Serum B12--497 -RBC folate--pending -hepatic architecture--WNL , no splenomegaly on abdominal US Hyperlipidemia  -restart statin  Family Communication: Wife updated at bedside Disposition Plan: SNF 09/26/14 if stable   Procedures/Studies: Dg Lumbar Spine Complete  09/11/2014   CLINICAL DATA:  Upper lumbar back pain after following this morning, midline low back pain without sciatica  EXAM: LUMBAR SPINE - COMPLETE 4+ VIEW  COMPARISON:  CT abdomen and pelvis 02/18/2013  FINDINGS: Hypoplastic last rib pair.  Five additional non rib-bearing lumbar type vertebrae.  Vertebral body heights maintained without fracture or subluxation.  No spondylolysis.  Scattered atherosclerotic calcifications.  Prior ORIF of the pubis.  Partial ankylosis of the SI joints.  IMPRESSION: No acute lumbar spine abnormalities   Electronically Signed   By: Lavonia Dana M.D.   On: 09/11/2014 17:57   Ct Head Wo Contrast  09/23/2014   CLINICAL DATA:  Acute onset of altered mental status and confusion. Hallucinations. Initial encounter.  EXAM: CT HEAD WITHOUT CONTRAST  TECHNIQUE: Contiguous axial images were obtained from the base of the skull through the vertex without intravenous contrast.  COMPARISON:  CT of the head performed 06/29/2006  FINDINGS: There is no evidence of acute infarction, mass lesion, or intra- or extra-axial hemorrhage on CT.  Prominence of the ventricles and sulci reflects mild cortical volume loss. Mild periventricular white matter change likely reflects small vessel ischemic microangiopathy.  The brainstem and fourth ventricle are within normal limits. The basal ganglia are unremarkable in appearance. The cerebral hemispheres demonstrate grossly normal gray-white differentiation. No mass effect or midline shift is seen.  There is no evidence of fracture; visualized  osseous structures are unremarkable in appearance. The orbits are within normal limits. The paranasal sinuses and mastoid air cells are well-aerated. No significant soft  tissue abnormalities are seen.  IMPRESSION: 1. No acute intracranial pathology seen on CT. 2. Mild cortical volume loss and scattered small vessel ischemic microangiopathy.   Electronically Signed   By: Garald Balding M.D.   On: 09/23/2014 04:43   US Abdomen Complete  09/23/2014   CLINICAL DATA:  Acute on chronic renal failure. Thrombocytopenia. Colon carcinoma.  EXAM: ULTRASOUND ABDOMEN COMPLETE  COMPARISON:  CT on 02/18/2013  FINDINGS: Gallbladder: Several small less than 1 cm gallstones are seen in the gallbladder neck. No evidence of gallbladder dilatation or wall thickening. No sonographic Murphy sign noted by sonographer.  Common bile duct: Diameter: 6 mm, which is within normal limits.  Liver: No focal lesion identified. Within normal limits i no evidence of splenomegaly. N parenchymal echogenicity.  IVC: No abnormality visualized.  Pancreas: Not well visualized due to patient habitus and bowel gas.  Spleen: Size and appearance within normal limits.  Right Kidney: Length: 9.8 cm. Echogenicity within normal limits. Small approximately 1.4 cm subcapsular cyst noted in the upper pole as better visualized on recent CT. No mass or hydronephrosis visualized.  Left Kidney: Length: 10.5 cm. Echogenicity within normal limits. No mass or hydronephrosis visualized.  Abdominal aorta: No aneurysm visualized.  Other findings: None.  IMPRESSION: Small less than 1 cm gallstones. No sonographic signs of acute cholecystitis or biliary dilatation.  Both kidneys are within normal limits in size and echogenicity. No evidence of hydronephrosis.   Electronically Signed   By: Earle Gell M.D.   On: 09/23/2014 14:12   Dg Chest Port 1 View  09/23/2014   CLINICAL DATA:  Confusion, weakness, hypotension, diabetes and UTI. Initial encounter.  EXAM: PORTABLE CHEST - 1  VIEW  COMPARISON:  08/08/2011 and prior chest radiographs.  FINDINGS: Upper limits normal heart size again noted.  Mild peribronchial thickening again identified.  Mild right basilar atelectasis is present.  There is no evidence of focal airspace disease, pulmonary edema, suspicious pulmonary nodule/mass, pleural effusion, or pneumothorax. No acute bony abnormalities are identified.  IMPRESSION: Mild right basilar atelectasis without other acute abnormality.  Chronic peribronchial thickening and elevation of the right hemidiaphragm.   Electronically Signed   By: Margarette Canada M.D.   On: 09/23/2014 13:20        Subjective: Patient denies fevers, chills, headache, chest pain, dyspnea, nausea, vomiting, diarrhea, abdominal pain, dysuria, hematuria. Still remains pleasantly confused and gets agitated at nighttime.   Objective: Filed Vitals:   09/24/14 1609 09/24/14 2113 09/25/14 0128 09/25/14 0608  BP: 154/77 136/68 146/81 108/90  Pulse:  72 92 92  Temp: 97.8 F (36.6 C) 98.6 F (37 C) 98.2 F (36.8 C) 97.7 F (36.5 C)  TempSrc: Oral Oral Axillary Axillary  Resp: 16 18 16 16   Height:      Weight:      SpO2: 95% 95% 95% 95%    Intake/Output Summary (Last 24 hours) at 09/25/14 1159 Last data filed at 09/25/14 0700  Gross per 24 hour  Intake   1250 ml  Output   3075 ml  Net  -1825 ml   Weight change:  Exam:   General:  Pt is alert, follows commands appropriately, not in acute distress  HEENT: No icterus, No thrush,  Paul/AT  Cardiovascular: RRR, S1/S2, no rubs, no gallops  Respiratory: CTA bilaterally, no wheezing, no crackles, no rhonchi  Abdomen: Soft/+BS, non tender, non distended, no guarding  Extremities: No edema, No lymphangitis, No petechiae, No rashes, no synovitis  Data Reviewed: Basic Metabolic  Panel:  Recent Labs Lab 09/22/14 2236 09/23/14 0418 09/24/14 0357 09/25/14 0445  NA 139 138 139 136  K 4.2 3.9 3.9 3.7  CL 105 109 111 105  CO2 26 26 25 24    GLUCOSE 122* 77 80 109*  BUN 38* 37* 22 12  CREATININE 2.33* 1.95* 1.30 1.12  CALCIUM 9.0 7.9* 8.2* 8.2*   Liver Function Tests:  Recent Labs Lab 09/22/14 2236  AST 20  ALT 16  ALKPHOS 53  BILITOT 0.7  PROT 6.9  ALBUMIN 3.9   No results for input(s): LIPASE, AMYLASE in the last 168 hours.  Recent Labs Lab 09/25/14 0445  AMMONIA 15   CBC:  Recent Labs Lab 09/22/14 2236 09/23/14 0418 09/24/14 0357 09/25/14 0445  WBC 7.3 6.2 7.2 8.1  NEUTROABS 5.2  --   --   --   HGB 13.0 11.0* 12.4* 13.2  HCT 39.1 34.2* 37.4* 39.8  MCV 93.1 93.2 91.9 89.6  PLT 128* 105* 95* 92*   Cardiac Enzymes:  Recent Labs Lab 09/23/14 1100  CKTOTAL 174   BNP: Invalid input(s): POCBNP CBG:  Recent Labs Lab 09/22/14 2240  GLUCAP 110*    Recent Results (from the past 240 hour(s))  Urine culture     Status: None   Collection Time: 09/23/14 12:40 AM  Result Value Ref Range Status   Specimen Description URINE, RANDOM  Final   Special Requests NONE  Final   Colony Count   Final    >=100,000 COLONIES/ML Performed at [facility]    Culture   Final    Multiple bacterial morphotypes present, none predominant. Suggest appropriate recollection if clinically indicated. Performed at [facility]    Report Status 09/24/2014 FINAL  Final  Culture, blood (routine x 2)     Status: None (Preliminary result)   Collection Time: 09/23/14  3:02 AM  Result Value Ref Range Status   Specimen Description BLOOD LEFT ANTECUBITAL  Final   Special Requests BOTTLES DRAWN AEROBIC AND ANAEROBIC 5ML  Final   Culture   Final           BLOOD CULTURE RECEIVED NO GROWTH TO DATE CULTURE WILL BE HELD FOR 5 DAYS BEFORE ISSUING A FINAL NEGATIVE REPORT Performed at [facility]    Report Status PENDING  Incomplete  Culture, blood (routine x 2)     Status: None (Preliminary result)   Collection Time: 09/23/14  3:02 AM  Result Value Ref Range Status   Specimen Description BLOOD RIGHT  ANTECUBITAL  Final   Special Requests BOTTLES DRAWN AEROBIC AND ANAEROBIC 5ML  Final   Culture   Final    GRAM POSITIVE COCCI IN CLUSTERS Note: CRITICAL RESULT CALLED TO, READ BACK BY AND VERIFIED WITH: KAMNA O ON 3.6.16 @ 1640 BY Justice Performed at [facility]    Report Status PENDING  Incomplete  MRSA PCR Screening     Status: None   Collection Time: 09/23/14  3:28 AM  Result Value Ref Range Status   MRSA by PCR NEGATIVE NEGATIVE Final    Comment:        The GeneXpert MRSA Assay (FDA approved for NASAL specimens only), is one component of a comprehensive MRSA colonization surveillance program. It is not intended to diagnose MRSA infection nor to guide or monitor treatment for MRSA infections.      Scheduled Meds: . aspirin EC  81 mg Oral Daily  . ceFEPime (MAXIPIME) IV  2 g Intravenous Q12H  .  heparin  5,000 Units Subcutaneous 3 times per day  . simvastatin  40 mg Oral QHS  . tamsulosin  0.4 mg Oral QPC breakfast  . vancomycin  750 mg Intravenous Q12H   Continuous Infusions: . sodium chloride 1,000 mL (09/23/14 0154)  . sodium chloride 75 mL/hr at 09/24/14 2003     Voshon Petro, DO  [facility] Pager [phone]  If 7PM-7AM, please contact night-coverage [email or web address] Password [email or web address] 09/25/2014, 11:59 AM   LOS: 2 days

## 2014-09-25 NOTE — Clinical Social Work Placement (Signed)
Clinical Social Work Department CLINICAL SOCIAL WORK PLACEMENT NOTE 09/25/2014  Patient:  Michael Holder, Michael Holder  Account Number:  [id number] Admit date:  09/22/2014  Clinical Social Worker:  Daiva Huge  Date/time:  09/25/2014 03:04 PM  Clinical Social Work is seeking post-discharge placement for this patient at the following level of care:   SKILLED NURSING   (*CSW will update this form in Epic as items are completed)   09/25/2014  Patient/family provided with [facility] Department of Clinical Social Work's list of facilities offering this level of care within the geographic area requested by the patient (or if unable, by the patient's family).  09/25/2014  Patient/family informed of their freedom to choose among providers that offer the needed level of care, that participate in Medicare, Medicaid or managed care program needed by the patient, have an available bed and are willing to accept the patient.  09/25/2014  Patient/family informed of [facility]' ownership interest in [facility], as well as of the fact that they are under no obligation to receive care at this facility.  PASARR submitted to EDS on 09/25/2014 PASARR number received on 09/25/2014  FL2 transmitted to all facilities in geographic area requested by pt/family on  09/25/2014 FL2 transmitted to all facilities within larger geographic area on   Patient informed that his/her managed care company has contracts with or will negotiate with  certain facilities, including the following:     Patient/family informed of bed offers received:   Patient chooses bed at  Physician recommends and patient chooses bed at    Patient to be transferred to  on   Patient to be transferred to facility by  Patient and family notified of transfer on  Name of family member notified:    The following physician request were entered in Epic:   Additional Comments: Eduard Clos, MSW, [place]

## 2014-09-25 NOTE — Telephone Encounter (Signed)
PLEASE NOTE: All timestamps contained within this report are represented as [place] Standard Time. CONFIDENTIALTY NOTICE: This fax transmission is intended only for the addressee. It contains information that is legally privileged, confidential or otherwise protected from use or disclosure. If you are not the intended recipient, you are strictly prohibited from reviewing, disclosing, copying using or disseminating any of this information or taking any action in reliance on or regarding this information. If you have received this fax in error, please notify us immediately by telephone so that we can arrange for its return to [place]. Phone: [phone], Toll-Free: [phone], Fax: [phone] Page: 1 of 2 Call Id: [id number] [place] Patient Name: Michael Holder Gender: Male DOB: March 19, 1946 Age: 69 Y 25 M 25 D Return Phone Number: [phone] (Primary) Address: City/State/Zip: [place] [address] Client [place] Night - Client Client Site [place] Physician [place], [place] Type Call Call Type Triage / Clinical Caller Name Acquanetta Belling Relationship To Patient Other relative Return Phone Number [phone] (Primary) Chief Complaint Hallucinations Initial Comment Caller states her brother in law is having hallucinations from his medication and they are pretty bad PreDisposition InappropriateToAsk Nurse Assessment Nurse: Glennon Mac, RN, Amber Date/Time (Eastern Time): 09/22/2014 8:30:01 PM Confirm and document reason for call. If symptomatic, describe symptoms. ---Caller states her brother-in-law was just placed on a new medication, Citalopram .He is seeing police cars and thinks someone is coming to get him. No previous hallucinations in past. Has the patient traveled out of the country within the last 30 days? ---No Does the patient  require triage? ---Yes Related visit to physician within the last 2 weeks? ---No Does the PT have any chronic conditions? (i.e. diabetes, asthma, etc.) ---Yes List chronic conditions. ---Depression HTN Guidelines Guideline Title Affirmed Question Affirmed Notes Nurse Date/Time (Eastern Time) Confusion - Delirium Seeing, hearing, or feeling things that are not there (i.e., visual, auditory, or tactile hallucinations) Glennon Mac, RN, [employer] 09/22/2014 8:34:48 PM Disp. Time Eilene Ghazi Time) Disposition Final User 09/22/2014 8:37:20 PM Send To RN Personal Glennon Mac, RN, Amber 09/22/2014 8:43:19 PM 911 Follow Up Call Attempted Glennon Mac, RN, Amber Reason: Caller states she is going to contact EMS at this time. 09/22/2014 8:36:48 PM Call EMS 911 Now Yes Glennon Mac, RN, [employer] PLEASE NOTE: All timestamps contained within this report are represented as [place] Standard Time. CONFIDENTIALTY NOTICE: This fax transmission is intended only for the addressee. It contains information that is legally privileged, confidential or otherwise protected from use or disclosure. If you are not the intended recipient, you are strictly prohibited from reviewing, disclosing, copying using or disseminating any of this information or taking any action in reliance on or regarding this information. If you have received this fax in error, please notify us immediately by telephone so that we can arrange for its return to [place]. Phone: [phone], Toll-Free: [phone], Fax: [phone] Page: 2 of 2 Call Id: [id number] Caller Understands: Yes Disagree/Comply: Comply Care Advice Given Per Guideline CALL EMS 911 NOW: Immediate medical attention is needed. You need to hang up and call 911 (or an ambulance). ([facility] Discretion: I'll call you back in a few minutes to be sure you were able to reach them.) CARE ADVICE given per Confusion- Delirium (Adult) guideline. After Care Instructions Given Call Event Type User Date / Time Description

## 2014-09-25 NOTE — Telephone Encounter (Signed)
Currently inpatient.
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
## 2014-09-25 NOTE — Clinical Social Work Psychosocial (Signed)
Clinical Social Work Department BRIEF PSYCHOSOCIAL ASSESSMENT 09/25/2014  Patient:  Michael Holder, Michael Holder     Account Number:  [id number]     Admit date:  09/22/2014  Clinical Social Worker:  Daiva Huge  Date/Time:  09/25/2014 02:46 PM  Referred by:  Physician  Date Referred:  09/25/2014 Referred for  SNF Placement   Other Referral:   Interview type:  Other - See comment Other interview type:   Met with patient and his wife at bedside    PSYCHOSOCIAL DATA Living Status:  FAMILY Admitted from facility:   Level of care:   Primary support name:  wife- Primary support relationship to patient:  FAMILY Degree of support available:   good    CURRENT CONCERNS Current Concerns  Post-Acute Placement   Other Concerns:    SOCIAL WORK ASSESSMENT / PLAN CSW met with patient and his wife to discuss SNF- Per wife, patient was admitted because of "hallucinations"- she shared this was a first occurrence leaving her quite concerned- "he has a uti and that's what caused it". She is relieved to know the antibiotics will and are helping to clear him. She also shared that he has been recently diagnosed with some dementia which they were managing at home. Wife reports they have no children but have extended family and friends who are supportive. They both are open to considering SNF at d/c to help him re-gain some strength before returning home.  CSW discussed the process and coverage for this (patient has [employer]) and they are agreeable to pursuing SNF at this time.   Assessment/plan status:  Other - See comment Other assessment/ plan:   FL2 and PASARR for SNF   Information/referral to community resources:   SNF List    PATIENT'S/FAMILY'S RESPONSE TO PLAN OF CARE: Patient's wife shared with CSW that she is familiar with the area SNF's from placing her mother- she appears very comfortable with this plan and is hoping for a bed close to home- preferences shared being [facility] and  [facility].    Wife is anxious to get results from the workup here and is hopeful they will have some answers- she is staying at his bedside "all day".  Support offered to wife and CSW will proceed with SNF search and update for probable dc tomorrow per MD>       Eduard Clos, MSW, [place]

## 2014-09-26 ENCOUNTER — Inpatient Hospital Stay (HOSPITAL_COMMUNITY): Payer: Medicare HMO

## 2014-09-26 LAB — CBC
HCT: 38.3 % — ABNORMAL LOW (ref 39.0–52.0)
Hemoglobin: 13 g/dL (ref 13.0–17.0)
MCH: 30.4 pg (ref 26.0–34.0)
MCHC: 33.9 g/dL (ref 30.0–36.0)
MCV: 89.5 fL (ref 78.0–100.0)
Platelets: 107 K/uL — ABNORMAL LOW (ref 150–400)
RBC: 4.28 MIL/uL (ref 4.22–5.81)
RDW: 12 % (ref 11.5–15.5)
WBC: 7.5 K/uL (ref 4.0–10.5)

## 2014-09-26 LAB — URINALYSIS, ROUTINE W REFLEX MICROSCOPIC
BILIRUBIN URINE: NEGATIVE
Glucose, UA: 100 mg/dL — AB
KETONES UR: 15 mg/dL — AB
NITRITE: NEGATIVE
PH: 5.5 (ref 5.0–8.0)
Protein, ur: NEGATIVE mg/dL
SPECIFIC GRAVITY, URINE: 1.01 (ref 1.005–1.030)
Urobilinogen, UA: 0.2 mg/dL (ref 0.0–1.0)

## 2014-09-26 LAB — BASIC METABOLIC PANEL WITH GFR
Anion gap: 6 (ref 5–15)
BUN: 9 mg/dL (ref 6–23)
CO2: 27 mmol/L (ref 19–32)
Calcium: 8.4 mg/dL (ref 8.4–10.5)
Chloride: 106 mmol/L (ref 96–112)
Creatinine, Ser: 1.13 mg/dL (ref 0.50–1.35)
GFR calc Af Amer: 75 mL/min — ABNORMAL LOW
GFR calc non Af Amer: 64 mL/min — ABNORMAL LOW
Glucose, Bld: 102 mg/dL — ABNORMAL HIGH (ref 70–99)
Potassium: 3.6 mmol/L (ref 3.5–5.1)
Sodium: 139 mmol/L (ref 135–145)

## 2014-09-26 LAB — GLUCOSE, CAPILLARY: Glucose-Capillary: 144 mg/dL — ABNORMAL HIGH (ref 70–99)

## 2014-09-26 LAB — BLOOD GAS, ARTERIAL
Acid-Base Excess: 2.2 mmol/L — ABNORMAL HIGH (ref 0.0–2.0)
Bicarbonate: 26.5 meq/L — ABNORMAL HIGH (ref 20.0–24.0)
Drawn by: 426241
FIO2: 0.21 %
O2 Saturation: 93.6 %
Patient temperature: 98.6
TCO2: 23.6 mmol/L (ref 0–100)
pCO2 arterial: 41.7 mmHg (ref 35.0–45.0)
pH, Arterial: 7.419 (ref 7.350–7.450)
pO2, Arterial: 66.4 mmHg — ABNORMAL LOW (ref 80.0–100.0)

## 2014-09-26 LAB — CULTURE, BLOOD (ROUTINE X 2)

## 2014-09-26 LAB — HIV ANTIBODY (ROUTINE TESTING W REFLEX): HIV Screen 4th Generation wRfx: NONREACTIVE

## 2014-09-26 LAB — URINE MICROSCOPIC-ADD ON

## 2014-09-26 MED ORDER — CIPROFLOXACIN IN D5W 200 MG/100ML IV SOLN
200.0000 mg | Freq: Two times a day (BID) | INTRAVENOUS | Status: DC
  Administered 2014-09-26 (×2): 200 mg via INTRAVENOUS
  Filled 2014-09-26 (×4): qty 100

## 2014-09-26 NOTE — Clinical Social Work Note (Signed)
CSW provided wife with SNF updated bed offers- she has selected [facility] since [facility] (her first choice) is not in network with his insurance. [facility] is working with [employer] to get SNF auth for SNF transfer once medically stable- MD at bedside discussing patient's current condition (less responsive) with wife. CSW will follow and update-  Eduard Clos, MSW, [place] [phone]

## 2014-09-26 NOTE — Discharge Summary (Signed)
Physician Discharge Summary  Michael Holder OEU:[id number] DOB: [birth year] DOA: 09/22/2014  PCP: Owens Loffler, MD  Admit date: 09/22/2014 Discharge date: 09/27/14 Recommendations for Outpatient Follow-up:  1. Pt will need to follow up with PCP in 2 weeks post discharge 2. Please obtain BMP and CBC  in one week  Discharge Diagnoses:  Sepsis -Present at the time of admission -Patient presented with hypotension, acute encephalopathy, with urinary source of infection -Continue intravenous antibiotics pending culture data -continue IV fluids during the hospitalization Acute encephalopathy -Secondary to dehydration, acute kidney injury, UTI, and recent start of Celexa -Slight improvement from the day of admission -Suspect the patient has underlying dementia -TSH--3.30/free T4--0.97 -d/c Celexa -check ammonia--15 -RPR--nonreactive -Serum B12--497 Acute Mental Status Change -09/26/14 AM--patient was obtunded--HR 85-18-141/108--95-97% on RA  --CBG 144  --stat CT head--neg   --ABG--7.49/41/66/26 on RA  --repeat UA--shows decreasing pyuria  --no opioids or hypnotics given -Approximately 60 minutes after my initial evaluation the patient woke up and was able to speak and recognizes wife  -09/27/14--appears back to baseline UTI -Continue empiric antibiotics given pt's clinical presentation -urine culture--multiple organisms -repeat UA shows improving pyuria -plan 3 more days of cipro after d/c which will complete 1 week of abx Bacteremia -GPC in one of 2 set-->CoNS, likely contaminant -d/c vancomycin 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) -improving with IVF -Renal ultrasound--no hydronephrosis -Continue IV fluids -FeNa--0.79% -CK 174 -Will not plan to restart lisinopril after discharge given risk for developing renal failure and hyperkalemia  -Serum creatinine 1.12 on day of  discharge Hyponatremia -Secondary to volume depletion -Continue IV fluids--improved -Sodium 138 on the day of discharge Diabetes mellitus type 2 -Hemoglobin A1c--5.7 -NovoLog sliding scale -Plan to restart glipizide after discharge Thrombocytopenia -Appears to be chronic dating back to July 2014 -Serum B12--497 -RBC folate--1175 -hepatic architecture--WNL , no splenomegaly on abdominal US -HIV antibody--neg Hyperlipidemia  -restart statin Essential HTN -d/c lisinopril -SBP increased to 160s -start amlodipine 2.67m daily  Discharge Condition: Stable  Disposition: SNR  Diet:carb modified Wt Readings from Last 3 Encounters:  09/26/14 85.821 kg (189 lb 3.2 oz)  09/13/14 89.018 kg (196 lb 4 oz)  09/11/14 89.812 kg (198 lb)    History of present illness:  69year old male presented to the emergency department with 4-5 day history of increasing paranoid thoughts and confusion. Family states that over the past 4-5 months, the patient has made a progressive decline with increasing confusion. His primary care physician is currently working up his mental status changes for dementia. More specifically, the patient was started on Celexa one week ago, and since that time, the family has noted increasing hallucinations and confusion. The patient's wife also states that he has had poor oral intake. There's been no fevers, chest pain, shortness breath, vomiting, diarrhea. At baseline, the patient is able to perform all his activities of daily living without any assistance. The patient was found to have acute on chronic renal failure. He was started on intravenous fluids with improvement of his renal function. He was also found to have a UTI. He was empirically started on cefepime. The urine culture unfortunately did not isolate an organism in pure Culture. The patient will be discharged with ciprofloxacin for 3 additional days which will complete one week of therapy. He was noted to be bacteremic.  He was started on vancomycin empirically. Culture results suggest a contaminant. Physical therapy saw the patient. They recommended skilled nursing facility. With the assistance of social work, the patient was transitioned  to SNF.    Discharge Exam: Filed Vitals:   09/27/14 0549  BP: 146/71  Pulse: 76  Temp: 97.5 F (36.4 C)  Resp: 20   Filed Vitals:   09/26/14 2126 09/26/14 2345 09/27/14 0150 09/27/14 0549  BP: 84/48 157/83 129/74 146/71  Pulse:  85 73 76  Temp:  98.2 F (36.8 C) 98.1 F (36.7 C) 97.5 F (36.4 C)  TempSrc:  Oral Oral Oral  Resp:  _0 Height:      Weight:      SpO2:  95% 96% 96%   General: Awake and alert, NAD, pleasant, cooperative Cardiovascular: RRR, no rub, no gallop, no S3 Respiratory: CTAB, no wheeze, no rhonchi Abdomen:soft, nontender, nondistended, positive bowel sounds Extremities: No edema, No lymphangitis, no petechiae  Discharge Instructions     Medication List    STOP taking these medications        citalopram 20 MG tablet  Commonly known as:  CELEXA     lisinopril 20 MG tablet  Commonly known as:  PRINIVIL,ZESTRIL      TAKE these medications        aspirin 81 MG EC tablet  Take 81 mg by mouth daily.     ciprofloxacin 500 MG tablet  Commonly known as:  CIPRO  Take 1 tablet (500 mg total) by mouth 2 (two) times daily.     glipiZIDE 5 MG 24 hr tablet  Commonly known as:  GLUCOTROL XL  Take 1 tablet (5 mg total) by mouth daily with breakfast.     glucose blood test strip  Commonly known as:  ONE TOUCH ULTRA TEST  Use as instructed     MIRALAX powder  Generic drug:  polyethylene glycol powder  Take 17 g by mouth daily as needed for mild constipation.     ONE TOUCH ULTRA SYSTEM KIT W/DEVICE Kit  1 kit by Does not apply route once.     onetouch ultrasoft lancets  Use as instructed     simvastatin 40 MG tablet  Commonly known as:  ZOCOR  Take 1 tablet (40 mg total) by mouth at bedtime.     tamsulosin 0.4 MG  Caps capsule  Commonly known as:  FLOMAX  Take 1 capsule (0.4 mg total) by mouth daily after breakfast.         The results of significant diagnostics from this hospitalization (including imaging, microbiology, ancillary and laboratory) are listed below for reference.    Significant Diagnostic Studies: Dg Lumbar Spine Complete  09/11/2014   CLINICAL DATA:  Upper lumbar back pain after following this morning, midline low back pain without sciatica  EXAM: LUMBAR SPINE - COMPLETE 4+ VIEW  COMPARISON:  CT abdomen and pelvis 02/18/2013  FINDINGS: Hypoplastic last rib pair.  Five additional non rib-bearing lumbar type vertebrae.  Vertebral body heights maintained without fracture or subluxation.  No spondylolysis.  Scattered atherosclerotic calcifications.  Prior ORIF of the pubis.  Partial ankylosis of the SI joints.  IMPRESSION: No acute lumbar spine abnormalities   Electronically Signed   By: Lavonia Dana M.D.   On: 09/11/2014 17:57   Ct Head Wo Contrast  09/26/2014   CLINICAL DATA:  Patient with confusion and altered mental status.  EXAM: CT HEAD WITHOUT CONTRAST  TECHNIQUE: Contiguous axial images were obtained from the base of the skull through the vertex without intravenous contrast.  COMPARISON:  09/23/2014.  FINDINGS: Periventricular and subcortical white matter hypodensity compatible with chronic small vessel ischemic change. Ventricles  and sulci are appropriate for patient's age. No evidence for acute cortically based infarct, intracranial hemorrhage, mass lesion or mass-effect. The orbits are unremarkable. Paranasal sinuses are unremarkable. Mastoid air cells are unremarkable. Calvarium is intact.  IMPRESSION: No acute intracranial process.   Electronically Signed   By: Lovey Newcomer M.D.   On: 09/26/2014 15:04   Ct Head Wo Contrast  09/23/2014   CLINICAL DATA:  Acute onset of altered mental status and confusion. Hallucinations. Initial encounter.  EXAM: CT HEAD WITHOUT CONTRAST  TECHNIQUE:  Contiguous axial images were obtained from the base of the skull through the vertex without intravenous contrast.  COMPARISON:  CT of the head performed 06/29/2006  FINDINGS: There is no evidence of acute infarction, mass lesion, or intra- or extra-axial hemorrhage on CT.  Prominence of the ventricles and sulci reflects mild cortical volume loss. Mild periventricular white matter change likely reflects small vessel ischemic microangiopathy.  The brainstem and fourth ventricle are within normal limits. The basal ganglia are unremarkable in appearance. The cerebral hemispheres demonstrate grossly normal gray-white differentiation. No mass effect or midline shift is seen.  There is no evidence of fracture; visualized osseous structures are unremarkable in appearance. The orbits are within normal limits. The paranasal sinuses and mastoid air cells are well-aerated. No significant soft tissue abnormalities are seen.  IMPRESSION: 1. No acute intracranial pathology seen on CT. 2. Mild cortical volume loss and scattered small vessel ischemic microangiopathy.   Electronically Signed   By: Garald Balding M.D.   On: 09/23/2014 04:43   US Abdomen Complete  09/23/2014   CLINICAL DATA:  Acute on chronic renal failure. Thrombocytopenia. Colon carcinoma.  EXAM: ULTRASOUND ABDOMEN COMPLETE  COMPARISON:  CT on 02/18/2013  FINDINGS: Gallbladder: Several small less than 1 cm gallstones are seen in the gallbladder neck. No evidence of gallbladder dilatation or wall thickening. No sonographic Murphy sign noted by sonographer.  Common bile duct: Diameter: 6 mm, which is within normal limits.  Liver: No focal lesion identified. Within normal limits i no evidence of splenomegaly. N parenchymal echogenicity.  IVC: No abnormality visualized.  Pancreas: Not well visualized due to patient habitus and bowel gas.  Spleen: Size and appearance within normal limits.  Right Kidney: Length: 9.8 cm. Echogenicity within normal limits. Small  approximately 1.4 cm subcapsular cyst noted in the upper pole as better visualized on recent CT. No mass or hydronephrosis visualized.  Left Kidney: Length: 10.5 cm. Echogenicity within normal limits. No mass or hydronephrosis visualized.  Abdominal aorta: No aneurysm visualized.  Other findings: None.  IMPRESSION: Small less than 1 cm gallstones. No sonographic signs of acute cholecystitis or biliary dilatation.  Both kidneys are within normal limits in size and echogenicity. No evidence of hydronephrosis.   Electronically Signed   By: Earle Gell M.D.   On: 09/23/2014 14:12   Dg Chest Port 1 View  09/23/2014   CLINICAL DATA:  Confusion, weakness, hypotension, diabetes and UTI. Initial encounter.  EXAM: PORTABLE CHEST - 1 VIEW  COMPARISON:  08/08/2011 and prior chest radiographs.  FINDINGS: Upper limits normal heart size again noted.  Mild peribronchial thickening again identified.  Mild right basilar atelectasis is present.  There is no evidence of focal airspace disease, pulmonary edema, suspicious pulmonary nodule/mass, pleural effusion, or pneumothorax. No acute bony abnormalities are identified.  IMPRESSION: Mild right basilar atelectasis without other acute abnormality.  Chronic peribronchial thickening and elevation of the right hemidiaphragm.   Electronically Signed   By: Cleatis Polka.D.  On: 09/23/2014 13:20     Microbiology: Recent Results (from the past 240 hour(s))  Urine culture     Status: None   Collection Time: 09/23/14 12:40 AM  Result Value Ref Range Status   Specimen Description URINE, RANDOM  Final   Special Requests NONE  Final   Colony Count   Final    >=100,000 COLONIES/ML Performed at [facility]    Culture   Final    Multiple bacterial morphotypes present, none predominant. Suggest appropriate recollection if clinically indicated. Performed at [facility]    Report Status 09/24/2014 FINAL  Final  Culture, blood (routine x 2)     Status: None  (Preliminary result)   Collection Time: 09/23/14  3:02 AM  Result Value Ref Range Status   Specimen Description BLOOD LEFT ANTECUBITAL  Final   Special Requests BOTTLES DRAWN AEROBIC AND ANAEROBIC 5ML  Final   Culture   Final           BLOOD CULTURE RECEIVED NO GROWTH TO DATE CULTURE WILL BE HELD FOR 5 DAYS BEFORE ISSUING A FINAL NEGATIVE REPORT Performed at [facility]    Report Status PENDING  Incomplete  Culture, blood (routine x 2)     Status: None   Collection Time: 09/23/14  3:02 AM  Result Value Ref Range Status   Specimen Description BLOOD RIGHT ANTECUBITAL  Final   Special Requests BOTTLES DRAWN AEROBIC AND ANAEROBIC 5ML  Final   Culture   Final    STAPHYLOCOCCUS SPECIES (COAGULASE NEGATIVE) Note: THE SIGNIFICANCE OF ISOLATING THIS ORGANISM FROM A SINGLE SET OF BLOOD CULTURES WHEN MULTIPLE SETS ARE DRAWN IS UNCERTAIN. PLEASE NOTIFY THE MICROBIOLOGY DEPARTMENT WITHIN ONE WEEK IF SPECIATION AND SENSITIVITIES ARE REQUIRED. Note: Gram Stain Report Called to,Read Back By and Verified With: KAMNA O ON 3.6.16 @ 1640 BY [place] Performed at [facility]    Report Status 09/26/2014 FINAL  Final  MRSA PCR Screening     Status: None   Collection Time: 09/23/14  3:28 AM  Result Value Ref Range Status   MRSA by PCR NEGATIVE NEGATIVE Final    Comment:        The GeneXpert MRSA Assay (FDA approved for NASAL specimens only), is one component of a comprehensive MRSA colonization surveillance program. It is not intended to diagnose MRSA infection nor to guide or monitor treatment for MRSA infections.      Labs: Basic Metabolic Panel:  Recent Labs Lab 09/23/14 0418 09/24/14 0357 09/25/14 0445 09/26/14 0505 09/27/14 0503  NA 138 139 136 139 138  K 3.9 3.9 3.7 3.6 3.8  CL 109 111 105 106 102  CO2 _0 GLUCOSE 77 80 109* 102* 101*  BUN 37* _1 CREATININE 1.95* 1.30 1.12 1.13 1.12  CALCIUM 7.9* 8.2* 8.2* 8.4 8.5   Liver Function  Tests:  Recent Labs Lab 09/22/14 2236  AST 20  ALT 16  ALKPHOS 53  BILITOT 0.7  PROT 6.9  ALBUMIN 3.9   No results for input(s): LIPASE, AMYLASE in the last 168 hours.  Recent Labs Lab 09/25/14 0445  AMMONIA 15   CBC:  Recent Labs Lab 09/22/14 2236 09/23/14 0418 09/23/14 1100 09/24/14 0357 09/25/14 0445 09/26/14 0505 09/27/14 0503  WBC 7.3 6.2  --  7.2 8.1 7.5 8.0  NEUTROABS 5.2  --   --   --   --   --   --   HGB 13.0 11.0*  --  12.4* 13.2 13.0 12.6*  HCT 39.1 34.2* 38.0 37.4* 39.8 38.3* 37.3*  MCV 93.1 93.2  --  91.9 89.6 89.5 89.4  PLT 128* 105*  --  95* 92* 107* 107*   Cardiac Enzymes:  Recent Labs Lab 09/23/14 1100  CKTOTAL 174   BNP: Invalid input(s): POCBNP CBG:  Recent Labs Lab 09/22/14 2240 09/26/14 1024  GLUCAP 110* 144*    Time coordinating discharge:  Greater than 30 minutes  Signed:  Deborah Lazcano, DO [facility] Pager: [phone] 09/27/2014, 10:04 AM

## 2014-09-26 NOTE — Progress Notes (Signed)
PROGRESS NOTE  Michael Holder:[id number] DOB: 11/13/1945 DOA: 09/22/2014 PCP: Owens Loffler, MD  Assessment/Plan: Sepsis -Present at the time of admission -Patient presented with hypotension, acute encephalopathy, with urinary source of infection -Continue intravenous antibiotics pending culture data -continue IV fluids during the hospitalization Acute encephalopathy -Secondary to dehydration, acute kidney injury, UTI, and recent start of Celexa -Slight improvement from the day of admission -Suspect the patient has underlying dementia -TSH--3.30/free T4--0.97 -d/c Celexa -check ammonia--15 -RPR--nonreactive -Serum B12--497 Acute Mental Status Change -09/26/14 AM--patient was obtunded--HR 85-18-141/108--95-97% on RA        --CBG 144        --stat CT head and EKG        --ABG--7.49/41/66/26 on RA        --repeat UA and urine culture        --no opioids or hypnotics given in past 24 hours -Approximately 60 minutes after my initial evaluation the patient woke up and was able to speak and recognizes wife although he was still slightly somnolent -hold d/c for today UTI -Continue empiric antibiotics given pt's clinical presentation -urine culture--multiple organisms -d/c cefepime--had slightly over 3 days -start cipro Bacteremia -GPC in one of 2 set-->CoNS, likely contaminant -continued vancomycin pending identification 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) -improving with IVF -Renal ultrasound--no hydronephrosis -Continue IV fluids -FeNa--0.79% -CK 174 -Will not plan to restart lisinopril after discharge given risk for developing renal failure and hyperkalemia  -Serum creatinine 1.13 Hyponatremia -Secondary to volume depletion -Continue IV fluids--improved -Sodium 139 today Diabetes mellitus type 2 -Hemoglobin A1c--5.7 -NovoLog sliding scale -Plan to restart glipizide after discharge Thrombocytopenia -Appears to be chronic dating back to July  2014 -Serum B12--497 -RBC folate--1175 -hepatic architecture--WNL , no splenomegaly on abdominal US -HIV antibody pending at the time of discharge please follow up on results Hyperlipidemia  -restart statin Essential HTN -d/c lisinopril -SBP increased to 160s -plan to start amlodipine 2.5mg  daily when stable     Family Communication:   Wife updated at beside 09/26/14 Disposition Plan:   SNF when medically stable       Procedures/Studies: Dg Lumbar Spine Complete  09/11/2014   CLINICAL DATA:  Upper lumbar back pain after following this morning, midline low back pain without sciatica  EXAM: LUMBAR SPINE - COMPLETE 4+ VIEW  COMPARISON:  CT abdomen and pelvis 02/18/2013  FINDINGS: Hypoplastic last rib pair.  Five additional non rib-bearing lumbar type vertebrae.  Vertebral body heights maintained without fracture or subluxation.  No spondylolysis.  Scattered atherosclerotic calcifications.  Prior ORIF of the pubis.  Partial ankylosis of the SI joints.  IMPRESSION: No acute lumbar spine abnormalities   Electronically Signed   By: Lavonia Dana M.D.   On: 09/11/2014 17:57   Ct Head Wo Contrast  09/23/2014   CLINICAL DATA:  Acute onset of altered mental status and confusion. Hallucinations. Initial encounter.  EXAM: CT HEAD WITHOUT CONTRAST  TECHNIQUE: Contiguous axial images were obtained from the base of the skull through the vertex without intravenous contrast.  COMPARISON:  CT of the head performed 06/29/2006  FINDINGS: There is no evidence of acute infarction, mass lesion, or intra- or extra-axial hemorrhage on CT.  Prominence of the ventricles and sulci reflects mild cortical volume loss. Mild periventricular white matter change likely reflects small vessel ischemic microangiopathy.  The brainstem and fourth ventricle are within normal limits. The basal ganglia are unremarkable in appearance. The cerebral hemispheres demonstrate  grossly normal gray-white differentiation. No mass effect or  midline shift is seen.  There is no evidence of fracture; visualized osseous structures are unremarkable in appearance. The orbits are within normal limits. The paranasal sinuses and mastoid air cells are well-aerated. No significant soft tissue abnormalities are seen.  IMPRESSION: 1. No acute intracranial pathology seen on CT. 2. Mild cortical volume loss and scattered small vessel ischemic microangiopathy.   Electronically Signed   By: Garald Balding M.D.   On: 09/23/2014 04:43   US Abdomen Complete  09/23/2014   CLINICAL DATA:  Acute on chronic renal failure. Thrombocytopenia. Colon carcinoma.  EXAM: ULTRASOUND ABDOMEN COMPLETE  COMPARISON:  CT on 02/18/2013  FINDINGS: Gallbladder: Several small less than 1 cm gallstones are seen in the gallbladder neck. No evidence of gallbladder dilatation or wall thickening. No sonographic Murphy sign noted by sonographer.  Common bile duct: Diameter: 6 mm, which is within normal limits.  Liver: No focal lesion identified. Within normal limits i no evidence of splenomegaly. N parenchymal echogenicity.  IVC: No abnormality visualized.  Pancreas: Not well visualized due to patient habitus and bowel gas.  Spleen: Size and appearance within normal limits.  Right Kidney: Length: 9.8 cm. Echogenicity within normal limits. Small approximately 1.4 cm subcapsular cyst noted in the upper pole as better visualized on recent CT. No mass or hydronephrosis visualized.  Left Kidney: Length: 10.5 cm. Echogenicity within normal limits. No mass or hydronephrosis visualized.  Abdominal aorta: No aneurysm visualized.  Other findings: None.  IMPRESSION: Small less than 1 cm gallstones. No sonographic signs of acute cholecystitis or biliary dilatation.  Both kidneys are within normal limits in size and echogenicity. No evidence of hydronephrosis.   Electronically Signed   By: Earle Gell M.D.   On: 09/23/2014 14:12   Dg Chest Port 1 View  09/23/2014   CLINICAL DATA:  Confusion, weakness,  hypotension, diabetes and UTI. Initial encounter.  EXAM: PORTABLE CHEST - 1 VIEW  COMPARISON:  08/08/2011 and prior chest radiographs.  FINDINGS: Upper limits normal heart size again noted.  Mild peribronchial thickening again identified.  Mild right basilar atelectasis is present.  There is no evidence of focal airspace disease, pulmonary edema, suspicious pulmonary nodule/mass, pleural effusion, or pneumothorax. No acute bony abnormalities are identified.  IMPRESSION: Mild right basilar atelectasis without other acute abnormality.  Chronic peribronchial thickening and elevation of the right hemidiaphragm.   Electronically Signed   By: Margarette Canada M.D.   On: 09/23/2014 13:20         Subjective: Pt obtunded--grimaces to protopathic stimuli.  Pt at breakfast around 830AM but drowsy since then.  No vomiting.  Had restful night.  No pain or respiratory distress  Objective: Filed Vitals:   09/25/14 1517 09/25/14 2243 09/26/14 0141 09/26/14 0605  BP: 168/82 137/78 153/78 160/85  Pulse: 87 77 81 83  Temp: 98.3 F (36.8 C) 98.5 F (36.9 C) 98.3 F (36.8 C) 98.2 F (36.8 C)  TempSrc: Oral Oral Oral Oral  Resp: 20 18 20 20   Height:      Weight:    85.821 kg (189 lb 3.2 oz)  SpO2: 97% 99% 95% 95%    Intake/Output Summary (Last 24 hours) at 09/26/14 1031 Last data filed at 09/26/14 0939  Gross per 24 hour  Intake   1970 ml  Output   1900 ml  Net     70 ml   Weight change:  Exam:   General:  Pt is alert, follows commands  appropriately, not in acute distress  HEENT: No icterus, No thrush, No meningismus, [place]/AT  Cardiovascular: RRR, S1/S2, no rubs, no gallops  Respiratory: diminished BS but CTA  Abdomen: Soft/+BS, non tender, non distended, no guarding  Extremities: No edema, No lymphangitis, No petechiae, No rashes, no synovitis  Data Reviewed: Basic Metabolic Panel:  Recent Labs Lab 09/22/14 2236 09/23/14 0418 09/24/14 0357 09/25/14 0445 09/26/14 0505  NA 139 138 139  136 139  K 4.2 3.9 3.9 3.7 3.6  CL 105 109 111 105 106  CO2 26 26 25 24 27   GLUCOSE 122* 77 80 109* 102*  BUN 38* 37* 22 12 9   CREATININE 2.33* 1.95* 1.30 1.12 1.13  CALCIUM 9.0 7.9* 8.2* 8.2* 8.4   Liver Function Tests:  Recent Labs Lab 09/22/14 2236  AST 20  ALT 16  ALKPHOS 53  BILITOT 0.7  PROT 6.9  ALBUMIN 3.9   No results for input(s): LIPASE, AMYLASE in the last 168 hours.  Recent Labs Lab 09/25/14 0445  AMMONIA 15   CBC:  Recent Labs Lab 09/22/14 2236 09/23/14 0418 09/23/14 1100 09/24/14 0357 09/25/14 0445 09/26/14 0505  WBC 7.3 6.2  --  7.2 8.1 7.5  NEUTROABS 5.2  --   --   --   --   --   HGB 13.0 11.0*  --  12.4* 13.2 13.0  HCT 39.1 34.2* 38.0 37.4* 39.8 38.3*  MCV 93.1 93.2  --  91.9 89.6 89.5  PLT 128* 105*  --  95* 92* 107*   Cardiac Enzymes:  Recent Labs Lab 09/23/14 1100  CKTOTAL 174   BNP: Invalid input(s): POCBNP CBG:  Recent Labs Lab 09/22/14 2240  GLUCAP 110*    Recent Results (from the past 240 hour(s))  Urine culture     Status: None   Collection Time: 09/23/14 12:40 AM  Result Value Ref Range Status   Specimen Description URINE, RANDOM  Final   Special Requests NONE  Final   Colony Count   Final    >=100,000 COLONIES/ML Performed at [facility]    Culture   Final    Multiple bacterial morphotypes present, none predominant. Suggest appropriate recollection if clinically indicated. Performed at [facility]    Report Status 09/24/2014 FINAL  Final  Culture, blood (routine x 2)     Status: None (Preliminary result)   Collection Time: 09/23/14  3:02 AM  Result Value Ref Range Status   Specimen Description BLOOD LEFT ANTECUBITAL  Final   Special Requests BOTTLES DRAWN AEROBIC AND ANAEROBIC 5ML  Final   Culture   Final           BLOOD CULTURE RECEIVED NO GROWTH TO DATE CULTURE WILL BE HELD FOR 5 DAYS BEFORE ISSUING A FINAL NEGATIVE REPORT Performed at [facility]    Report Status PENDING   Incomplete  Culture, blood (routine x 2)     Status: None (Preliminary result)   Collection Time: 09/23/14  3:02 AM  Result Value Ref Range Status   Specimen Description BLOOD RIGHT ANTECUBITAL  Final   Special Requests BOTTLES DRAWN AEROBIC AND ANAEROBIC 5ML  Final   Culture   Final    GRAM POSITIVE COCCI IN CLUSTERS Note: CRITICAL RESULT CALLED TO, READ BACK BY AND VERIFIED WITH: KAMNA O ON 3.6.16 @ 1640 BY [place] Performed at [facility]    Report Status PENDING  Incomplete  MRSA PCR Screening     Status: None   Collection Time: 09/23/14  3:28 AM  Result Value Ref Range Status   MRSA by PCR NEGATIVE NEGATIVE Final    Comment:        The GeneXpert MRSA Assay (FDA approved for NASAL specimens only), is one component of a comprehensive MRSA colonization surveillance program. It is not intended to diagnose MRSA infection nor to guide or monitor treatment for MRSA infections.      Scheduled Meds: . aspirin EC  81 mg Oral Daily  . heparin  5,000 Units Subcutaneous 3 times per day  . simvastatin  40 mg Oral QHS  . tamsulosin  0.4 mg Oral QPC breakfast  . vancomycin  1,000 mg Intravenous Q12H   Continuous Infusions: . sodium chloride 75 mL/hr at 09/26/14 0523     Franke Menter, DO  [facility] Pager [phone]  If 7PM-7AM, please contact night-coverage [email or web address] Password [email or web address] 09/26/2014, 10:31 AM   LOS: 3 days

## 2014-09-27 LAB — CBC
HCT: 37.3 % — ABNORMAL LOW (ref 39.0–52.0)
Hemoglobin: 12.6 g/dL — ABNORMAL LOW (ref 13.0–17.0)
MCH: 30.2 pg (ref 26.0–34.0)
MCHC: 33.8 g/dL (ref 30.0–36.0)
MCV: 89.4 fL (ref 78.0–100.0)
Platelets: 107 10*3/uL — ABNORMAL LOW (ref 150–400)
RBC: 4.17 MIL/uL — ABNORMAL LOW (ref 4.22–5.81)
RDW: 12.2 % (ref 11.5–15.5)
WBC: 8 10*3/uL (ref 4.0–10.5)

## 2014-09-27 LAB — BASIC METABOLIC PANEL
Anion gap: 7 (ref 5–15)
BUN: 8 mg/dL (ref 6–23)
CO2: 29 mmol/L (ref 19–32)
Calcium: 8.5 mg/dL (ref 8.4–10.5)
Chloride: 102 mmol/L (ref 96–112)
Creatinine, Ser: 1.12 mg/dL (ref 0.50–1.35)
GFR calc non Af Amer: 65 mL/min — ABNORMAL LOW (ref >90)
GFR, EST AFRICAN AMERICAN: 76 mL/min — AB (ref >90)
Glucose, Bld: 101 mg/dL — ABNORMAL HIGH (ref 70–99)
POTASSIUM: 3.8 mmol/L (ref 3.5–5.1)
Sodium: 138 mmol/L (ref 135–145)

## 2014-09-27 LAB — URINE CULTURE
Colony Count: NO GROWTH
Culture: NO GROWTH

## 2014-09-27 MED ORDER — CIPROFLOXACIN HCL 500 MG PO TABS
500.0000 mg | ORAL_TABLET | Freq: Two times a day (BID) | ORAL | Status: DC
  Administered 2014-09-27 – 2014-09-28 (×3): 500 mg via ORAL
  Filled 2014-09-27 (×3): qty 1

## 2014-09-27 MED ORDER — CIPROFLOXACIN HCL 500 MG PO TABS: 500.0000 mg | ORAL_TABLET | Freq: Two times a day (BID) | ORAL | Status: AC

## 2014-09-27 NOTE — Progress Notes (Signed)
CSW continuing to follow as plan is for pt to discharge to [facility].  CSW spoke with [facility] who has been working on getting authorization through [employer] and facility states that they have not yet received authorization from pt insurance for pt to admit to SNF today. [facility] is not willing to accept a Letter of Guarantee (LOG) until insurance authorization received.   Coker will follow up with [facility] in the morning with hopes that insurance authorization can be received tomorrow for pt to transition to [facility].   Per chart, pt max assist with transfers and unable to ambulate, therefore pt is not a safe d/c home at this time.   CSW updated pt wife.   CSW updated RN.  CSW to continue to follow and facilitate pt discharge needs to [facility] when facility received [facility] insurance authorization.  Alison Murray, MSW, LCSW Clinical Social Work Coverage for [employer], [place]

## 2014-09-27 NOTE — Care Management Note (Addendum)
    Page 1 of 1   09/28/2014     12:03:40 PM CARE MANAGEMENT NOTE 09/28/2014  Patient:  Michael Holder, Michael Holder   Account Number:  [id number]  Date Initiated:  09/27/2014  Documentation initiated by:  Dessa Phi  Subjective/Objective Assessment:   70 y/o m admitted w/sepsis.     Action/Plan:   From home.   Anticipated DC Date:  09/28/2014   Anticipated DC Plan:  [place]  CM consult      Choice offered to / List presented to:             Status of service:  Completed, signed off Medicare Important Message given?  YES (If response is "NO", the following Medicare IM given date fields will be blank) Date Medicare IM given:  09/27/2014 Medicare IM given by:  [facility] Date Additional Medicare IM given:   Additional Medicare IM given by:    Discharge Disposition:  [place]  Per UR Regulation:  Reviewed for med. necessity/level of care/duration of stay  If discussed at [place] of Stay Meetings, dates discussed:   09/28/2014    Comments:  09/28/14 Dessa Phi RN BSN NCM [phone] d/c snf-await [facility] Kaufmann.  09/27/14 Dessa Phi RN BSN NCM [phone] d/c snf-await [facility].
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
## 2014-09-28 NOTE — Plan of Care (Signed)
Problem: Phase I Progression Outcomes Goal: OOB as tolerated unless otherwise ordered Outcome: Progressing PT worked with patient yesterday

## 2014-09-28 NOTE — Clinical Social Work Placement (Signed)
Clinical Social Work Department CLINICAL SOCIAL WORK PLACEMENT NOTE 09/28/2014  Patient:  ARCHER, MOIST  Account Number:  [id number] Admit date:  09/22/2014  Clinical Social Worker:  Daiva Huge  Date/time:  09/25/2014 03:04 PM  Clinical Social Work is seeking post-discharge placement for this patient at the following level of care:   SKILLED NURSING   (*CSW will update this form in Epic as items are completed)   09/25/2014  Patient/family provided with [place] Department of Clinical Social Work's list of facilities offering this level of care within the geographic area requested by the patient (or if unable, by the patient's family).  09/25/2014  Patient/family informed of their freedom to choose among providers that offer the needed level of care, that participate in Medicare, Medicaid or managed care program needed by the patient, have an available bed and are willing to accept the patient.  09/25/2014  Patient/family informed of [facility]' ownership interest in [facility], as well as of the fact that they are under no obligation to receive care at this facility.  PASARR submitted to EDS on 09/25/2014 PASARR number received on 09/25/2014  FL2 transmitted to all facilities in geographic area requested by pt/family on  09/25/2014 FL2 transmitted to all facilities within larger geographic area on   Patient informed that his/her managed care company has contracts with or will negotiate with  certain facilities, including the following:     Patient/family informed of bed offers received:  09/27/2014 Patient chooses bed at [facility] Physician recommends and patient chooses bed at    Patient to be transferred to [facility] on  09/28/2014 Patient to be transferred to facility by ambulance Patient and family notified of transfer on 09/28/2014 Name of family member notified:  Jackelyn Poling  The following physician request were entered in  Epic:   Additional Comments:  .Dede Query, [place] Social Worker - Weekend Coverage cell #: [phone]

## 2014-09-28 NOTE — Progress Notes (Signed)
The patient was seen and examined, scheduled for discharge today with discharge summary previously completed by Dr. Carles Collet.  Stable for discharge with no changes to the discharge summary previously dictated.  RAMA,CHRISTINA 09/28/2014

## 2014-09-29 LAB — CULTURE, BLOOD (ROUTINE X 2): CULTURE: NO GROWTH

## 2014-10-18 ENCOUNTER — Ambulatory Visit: Payer: Medicare HMO | Admitting: Family Medicine

## 2014-11-16 ENCOUNTER — Telehealth: Payer: Self-pay | Admitting: Family Medicine

## 2014-11-16 ENCOUNTER — Ambulatory Visit: Payer: Medicare HMO | Admitting: Family Medicine

## 2014-11-16 NOTE — Telephone Encounter (Signed)
Michael Holder is currently in a rehab facility.  They wanted to let Dr. Lorelei Pont know he is currently in and out having hallucinations as well as having trouble with his balance. They have had some test ran but are still waiting on those results.  Once he is released home,  they will call back to reschedule his appointment.

## 2014-11-16 NOTE — Telephone Encounter (Signed)
Can you call and check on Michael Holder? He had an appointment today that he missed.   I just wanted to make sure he was ok.

## 2014-11-16 NOTE — Telephone Encounter (Signed)
Patient did not come in for their appointment today for follow up from [facility].  Please let me know if patient needs to be contacted immediately for follow up or no follow up needed.

## 2014-11-23 ENCOUNTER — Inpatient Hospital Stay (HOSPITAL_COMMUNITY)
Admission: EM | Admit: 2014-11-23 | Discharge: 2014-12-20 | DRG: 064 | Disposition: E | Payer: Medicare HMO | Attending: Neurology | Admitting: Neurology

## 2014-11-23 ENCOUNTER — Emergency Department (HOSPITAL_COMMUNITY): Payer: Medicare HMO

## 2014-11-23 ENCOUNTER — Other Ambulatory Visit (HOSPITAL_COMMUNITY): Payer: Self-pay

## 2014-11-23 DIAGNOSIS — Z820 Family history of epilepsy and other diseases of the nervous system: Secondary | ICD-10-CM

## 2014-11-23 DIAGNOSIS — G936 Cerebral edema: Secondary | ICD-10-CM | POA: Insufficient documentation

## 2014-11-23 DIAGNOSIS — Z923 Personal history of irradiation: Secondary | ICD-10-CM

## 2014-11-23 DIAGNOSIS — I161 Hypertensive emergency: Secondary | ICD-10-CM | POA: Insufficient documentation

## 2014-11-23 DIAGNOSIS — Z9861 Coronary angioplasty status: Secondary | ICD-10-CM

## 2014-11-23 DIAGNOSIS — E1149 Type 2 diabetes mellitus with other diabetic neurological complication: Secondary | ICD-10-CM | POA: Diagnosis present

## 2014-11-23 DIAGNOSIS — Z823 Family history of stroke: Secondary | ICD-10-CM

## 2014-11-23 DIAGNOSIS — R680 Hypothermia, not associated with low environmental temperature: Secondary | ICD-10-CM | POA: Diagnosis present

## 2014-11-23 DIAGNOSIS — R296 Repeated falls: Secondary | ICD-10-CM | POA: Diagnosis present

## 2014-11-23 DIAGNOSIS — R40243 Glasgow coma scale score 3-8: Secondary | ICD-10-CM | POA: Diagnosis present

## 2014-11-23 DIAGNOSIS — Z87891 Personal history of nicotine dependence: Secondary | ICD-10-CM

## 2014-11-23 DIAGNOSIS — Z825 Family history of asthma and other chronic lower respiratory diseases: Secondary | ICD-10-CM | POA: Diagnosis not present

## 2014-11-23 DIAGNOSIS — Z833 Family history of diabetes mellitus: Secondary | ICD-10-CM

## 2014-11-23 DIAGNOSIS — Z8249 Family history of ischemic heart disease and other diseases of the circulatory system: Secondary | ICD-10-CM

## 2014-11-23 DIAGNOSIS — Z981 Arthrodesis status: Secondary | ICD-10-CM

## 2014-11-23 DIAGNOSIS — E785 Hyperlipidemia, unspecified: Secondary | ICD-10-CM | POA: Diagnosis present

## 2014-11-23 DIAGNOSIS — G935 Compression of brain: Secondary | ICD-10-CM | POA: Diagnosis not present

## 2014-11-23 DIAGNOSIS — I252 Old myocardial infarction: Secondary | ICD-10-CM

## 2014-11-23 DIAGNOSIS — I611 Nontraumatic intracerebral hemorrhage in hemisphere, cortical: Principal | ICD-10-CM | POA: Diagnosis present

## 2014-11-23 DIAGNOSIS — Z515 Encounter for palliative care: Secondary | ICD-10-CM | POA: Insufficient documentation

## 2014-11-23 DIAGNOSIS — Z85038 Personal history of other malignant neoplasm of large intestine: Secondary | ICD-10-CM

## 2014-11-23 DIAGNOSIS — I2510 Atherosclerotic heart disease of native coronary artery without angina pectoris: Secondary | ICD-10-CM | POA: Diagnosis present

## 2014-11-23 DIAGNOSIS — Z66 Do not resuscitate: Secondary | ICD-10-CM | POA: Diagnosis not present

## 2014-11-23 DIAGNOSIS — I619 Nontraumatic intracerebral hemorrhage, unspecified: Secondary | ICD-10-CM | POA: Diagnosis not present

## 2014-11-23 DIAGNOSIS — R4182 Altered mental status, unspecified: Secondary | ICD-10-CM | POA: Diagnosis present

## 2014-11-23 DIAGNOSIS — I10 Essential (primary) hypertension: Secondary | ICD-10-CM | POA: Diagnosis not present

## 2014-11-23 DIAGNOSIS — J9601 Acute respiratory failure with hypoxia: Secondary | ICD-10-CM | POA: Insufficient documentation

## 2014-11-23 LAB — CBC WITH DIFFERENTIAL/PLATELET
BASOS PCT: 0 % (ref 0–1)
Basophils Absolute: 0 10*3/uL (ref 0.0–0.1)
EOS ABS: 0 10*3/uL (ref 0.0–0.7)
Eosinophils Relative: 0 % (ref 0–5)
HCT: 38.2 % — ABNORMAL LOW (ref 39.0–52.0)
Hemoglobin: 12.9 g/dL — ABNORMAL LOW (ref 13.0–17.0)
LYMPHS ABS: 0.3 10*3/uL — AB (ref 0.7–4.0)
Lymphocytes Relative: 3 % — ABNORMAL LOW (ref 12–46)
MCH: 29.9 pg (ref 26.0–34.0)
MCHC: 33.8 g/dL (ref 30.0–36.0)
MCV: 88.4 fL (ref 78.0–100.0)
MONOS PCT: 5 % (ref 3–12)
Monocytes Absolute: 0.5 10*3/uL (ref 0.1–1.0)
NEUTROS ABS: 9.5 10*3/uL — AB (ref 1.7–7.7)
Neutrophils Relative %: 92 % — ABNORMAL HIGH (ref 43–77)
PLATELETS: 158 10*3/uL (ref 150–400)
RBC: 4.32 MIL/uL (ref 4.22–5.81)
RDW: 12.5 % (ref 11.5–15.5)
WBC: 10.3 10*3/uL (ref 4.0–10.5)

## 2014-11-23 LAB — PROTIME-INR
INR: 1.04 (ref 0.00–1.49)
Prothrombin Time: 13.7 seconds (ref 11.6–15.2)

## 2014-11-23 LAB — URINE MICROSCOPIC-ADD ON

## 2014-11-23 LAB — COMPREHENSIVE METABOLIC PANEL
ALBUMIN: 3.3 g/dL — AB (ref 3.5–5.0)
ALK PHOS: 75 U/L (ref 38–126)
ALT: 16 U/L — AB (ref 17–63)
AST: 33 U/L (ref 15–41)
Anion gap: 11 (ref 5–15)
BUN: 10 mg/dL (ref 6–20)
CO2: 26 mmol/L (ref 22–32)
Calcium: 8.8 mg/dL — ABNORMAL LOW (ref 8.9–10.3)
Chloride: 95 mmol/L — ABNORMAL LOW (ref 101–111)
Creatinine, Ser: 1.01 mg/dL (ref 0.61–1.24)
GFR calc Af Amer: >60 mL/min (ref >60)
GFR calc non Af Amer: >60 mL/min (ref >60)
Glucose, Bld: 216 mg/dL — ABNORMAL HIGH (ref 70–99)
POTASSIUM: 4.7 mmol/L (ref 3.5–5.1)
SODIUM: 132 mmol/L — AB (ref 135–145)
TOTAL PROTEIN: 6.5 g/dL (ref 6.5–8.1)
Total Bilirubin: 1 mg/dL (ref 0.3–1.2)

## 2014-11-23 LAB — URINALYSIS, ROUTINE W REFLEX MICROSCOPIC
BILIRUBIN URINE: NEGATIVE
Glucose, UA: 250 mg/dL — AB
HGB URINE DIPSTICK: NEGATIVE
Ketones, ur: 15 mg/dL — AB
Leukocytes, UA: NEGATIVE
Nitrite: NEGATIVE
PH: 6.5 (ref 5.0–8.0)
Protein, ur: 30 mg/dL — AB
SPECIFIC GRAVITY, URINE: 1.014 (ref 1.005–1.030)
Urobilinogen, UA: 0.2 mg/dL (ref 0.0–1.0)

## 2014-11-23 LAB — I-STAT CHEM 8, ED
BUN: 12 mg/dL (ref 6–20)
Calcium, Ion: 1.14 mmol/L (ref 1.13–1.30)
Chloride: 94 mmol/L — ABNORMAL LOW (ref 101–111)
Creatinine, Ser: 0.8 mg/dL (ref 0.61–1.24)
Glucose, Bld: 218 mg/dL — ABNORMAL HIGH (ref 70–99)
HEMATOCRIT: 42 % (ref 39.0–52.0)
HEMOGLOBIN: 14.3 g/dL (ref 13.0–17.0)
Potassium: 4.5 mmol/L (ref 3.5–5.1)
SODIUM: 132 mmol/L — AB (ref 135–145)
TCO2: 25 mmol/L (ref 0–100)

## 2014-11-23 LAB — I-STAT TROPONIN, ED
TROPONIN I, POC: 0 ng/mL (ref 0.00–0.08)
Troponin i, poc: 0 ng/mL (ref 0.00–0.08)

## 2014-11-23 LAB — I-STAT CG4 LACTIC ACID, ED: Lactic Acid, Venous: 1.48 mmol/L (ref 0.5–2.0)

## 2014-11-23 LAB — APTT: aPTT: 31 seconds (ref 24–37)

## 2014-11-23 LAB — I-STAT ARTERIAL BLOOD GAS, ED
Acid-base deficit: 2 mmol/L (ref 0.0–2.0)
Bicarbonate: 22.9 mEq/L (ref 20.0–24.0)
O2 Saturation: 99 %
TCO2: 24 mmol/L (ref 0–100)
pCO2 arterial: 38 mmHg (ref 35.0–45.0)
pH, Arterial: 7.388 (ref 7.350–7.450)
pO2, Arterial: 142 mmHg — ABNORMAL HIGH (ref 80.0–100.0)

## 2014-11-23 LAB — SODIUM: Sodium: 133 mmol/L — ABNORMAL LOW (ref 135–145)

## 2014-11-23 LAB — MRSA PCR SCREENING: MRSA BY PCR: NEGATIVE

## 2014-11-23 MED ORDER — MANNITOL 20 % IV SOLN
0.2500 g/kg | Freq: Four times a day (QID) | INTRAVENOUS | Status: DC
  Filled 2014-11-23 (×2): qty 500

## 2014-11-23 MED ORDER — MANNITOL 20 % IV SOLN
1.0000 g/kg | Freq: Once | INTRAVENOUS | Status: AC
  Administered 2014-11-23: 85.8 g via INTRAVENOUS
  Filled 2014-11-23: qty 500

## 2014-11-23 MED ORDER — PIPERACILLIN-TAZOBACTAM 3.375 G IVPB 30 MIN
3.3750 g | Freq: Once | INTRAVENOUS | Status: AC
  Administered 2014-11-23: 3.375 g via INTRAVENOUS
  Filled 2014-11-23: qty 50

## 2014-11-23 MED ORDER — STROKE: EARLY STAGES OF RECOVERY BOOK
Freq: Once | Status: AC
  Administered 2014-11-23: 23:00:00
  Filled 2014-11-23: qty 1

## 2014-11-23 MED ORDER — PANTOPRAZOLE SODIUM 40 MG IV SOLR
40.0000 mg | Freq: Every day | INTRAVENOUS | Status: DC
  Administered 2014-11-23: 40 mg via INTRAVENOUS
  Filled 2014-11-23: qty 40

## 2014-11-23 MED ORDER — SENNOSIDES-DOCUSATE SODIUM 8.6-50 MG PO TABS
1.0000 | ORAL_TABLET | Freq: Two times a day (BID) | ORAL | Status: DC
  Administered 2014-11-23: 1 via ORAL
  Filled 2014-11-23: qty 1

## 2014-11-23 MED ORDER — NICARDIPINE HCL IN NACL 20-0.86 MG/200ML-% IV SOLN
3.0000 mg/h | Freq: Once | INTRAVENOUS | Status: AC
  Administered 2014-11-23: 5 mg/h via INTRAVENOUS
  Filled 2014-11-23: qty 200

## 2014-11-23 MED ORDER — SODIUM CHLORIDE 0.9 % IV SOLN
INTRAVENOUS | Status: DC
  Administered 2014-11-23: 21:00:00 via INTRAVENOUS

## 2014-11-23 MED ORDER — ACETAMINOPHEN 325 MG PO TABS: 650.0000 mg | ORAL_TABLET | ORAL | Status: DC | PRN

## 2014-11-23 MED ORDER — ETOMIDATE 2 MG/ML IV SOLN
INTRAVENOUS | Status: AC | PRN
  Administered 2014-11-23: 20 mg via INTRAVENOUS

## 2014-11-23 MED ORDER — SUCCINYLCHOLINE CHLORIDE 20 MG/ML IJ SOLN
INTRAMUSCULAR | Status: AC | PRN
  Administered 2014-11-23: 100 mg via INTRAVENOUS

## 2014-11-23 MED ORDER — SODIUM CHLORIDE 0.9 % IV SOLN
2000.0000 mg | Freq: Once | INTRAVENOUS | Status: AC
  Administered 2014-11-23: 2000 mg via INTRAVENOUS
  Filled 2014-11-23: qty 2000

## 2014-11-23 MED ORDER — ACETAMINOPHEN 650 MG RE SUPP: 650.0000 mg | RECTAL | Status: DC | PRN

## 2014-11-23 MED ORDER — LABETALOL HCL 5 MG/ML IV SOLN: 10.0000 mg | INTRAVENOUS | Status: DC | PRN
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
## 2014-11-23 NOTE — ED Notes (Signed)
Roxanne Mins, MD notified re: pt GCS 4
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
## 2014-11-23 NOTE — Progress Notes (Signed)
ABG drawn and handed to Dr. Eliberto Ivory. No vent changes were ordered or suggested.  Report called to April (at 21:35) on [place].

## 2014-11-23 NOTE — ED Notes (Signed)
Pt transported to CT with this RN, and Shelli, RT.

## 2014-11-23 NOTE — ED Notes (Signed)
Pt to be intubated d/t GCS, Roxanne Mins, MD aware of rectal temp

## 2014-11-23 NOTE — Progress Notes (Signed)
Chaplain paged.   Pt spouse and other family members in consult room B awaiting visit from physician.   Delford Field, Chaplain 12/12/2014

## 2014-11-23 NOTE — Progress Notes (Signed)
Transported to and from CT on vent 19:35 to 19:55

## 2014-11-23 NOTE — ED Notes (Signed)
Pt returned to room, Dr. Roxanne Mins and resident at bedside after viewing CT images.

## 2014-11-23 NOTE — ED Notes (Signed)
Family at bedside. 

## 2014-11-23 NOTE — Progress Notes (Signed)
Transported pt from ER to NICU, attached vent, monitored vent, and notified April that pt was delivered to room 11.

## 2014-11-23 NOTE — ED Notes (Signed)
Pt coming from home via [facility], per report pt was noted by wife to be LSN @ 14:00 today, pts wife reports that he was sleeping & was unresponsive today @ 18:00, upon arrival GCS 8, pt responsive to painful stimuli on EMS arrival to home, upon arrival to ED pt was unresponsive to painful stimuli, pt has nasal trumpet in R nare, #18 L AC, CBG 212, pt rcvd 2 mg Narcan pta without response, pt reported to have recently been treated for UTI with unknown abx, pt in NSR during transport
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
## 2014-11-23 NOTE — ED Notes (Signed)
Neurology at bedside.

## 2014-11-24 DIAGNOSIS — G936 Cerebral edema: Secondary | ICD-10-CM

## 2014-11-24 DIAGNOSIS — J9601 Acute respiratory failure with hypoxia: Secondary | ICD-10-CM

## 2014-11-24 DIAGNOSIS — I619 Nontraumatic intracerebral hemorrhage, unspecified: Secondary | ICD-10-CM

## 2014-11-24 DIAGNOSIS — I10 Essential (primary) hypertension: Secondary | ICD-10-CM

## 2014-11-24 DIAGNOSIS — Z515 Encounter for palliative care: Secondary | ICD-10-CM | POA: Insufficient documentation

## 2014-11-24 DIAGNOSIS — G935 Compression of brain: Secondary | ICD-10-CM

## 2014-11-24 DIAGNOSIS — I161 Hypertensive emergency: Secondary | ICD-10-CM | POA: Insufficient documentation

## 2014-11-24 MED ORDER — POLYVINYL ALCOHOL 1.4 % OP SOLN
1.0000 [drp] | Freq: Four times a day (QID) | OPHTHALMIC | Status: DC | PRN
  Filled 2014-11-24: qty 15

## 2014-11-24 MED ORDER — MORPHINE BOLUS VIA INFUSION
5.0000 mg | INTRAVENOUS | Status: DC | PRN
  Filled 2014-11-24: qty 20

## 2014-11-24 MED ORDER — MORPHINE SULFATE 25 MG/ML IV SOLN
10.0000 mg/h | INTRAVENOUS | Status: DC
  Administered 2014-11-24: 10 mg/h via INTRAVENOUS
  Filled 2014-11-24: qty 10

## 2014-11-24 MED ORDER — LORAZEPAM 2 MG/ML IJ SOLN: 1.0000 mg | INTRAMUSCULAR | Status: DC | PRN

## 2014-11-24 MED ORDER — LORAZEPAM 2 MG/ML PO CONC: 1.0000 mg | ORAL | Status: DC | PRN

## 2014-11-24 MED ORDER — ATROPINE SULFATE 1 % OP SOLN
4.0000 [drp] | OPHTHALMIC | Status: DC | PRN
  Filled 2014-11-24: qty 2

## 2014-11-24 MED ORDER — LORAZEPAM 1 MG PO TABS: 1.0000 mg | ORAL_TABLET | ORAL | Status: DC | PRN

## 2014-11-26 LAB — URINE CULTURE: Colony Count: 2000

## 2014-11-27 ENCOUNTER — Telehealth: Payer: Self-pay | Admitting: Family Medicine

## 2014-11-27 NOTE — Telephone Encounter (Signed)
i called Debbie to express my condolences about Steve's passing.

## 2014-11-27 NOTE — Telephone Encounter (Signed)
Jackelyn Poling (spouse) called to let you know mr pless passed 3:42 Friday afternoon at [facility] and fell hit head

## 2014-11-29 ENCOUNTER — Encounter (HOSPITAL_COMMUNITY): Payer: Medicare HMO

## 2014-11-29 ENCOUNTER — Ambulatory Visit: Payer: Medicare HMO | Admitting: Family

## 2014-11-30 LAB — CULTURE, BLOOD (ROUTINE X 2)
Culture: NO GROWTH
Culture: NO GROWTH

## 2014-12-20 NOTE — Discharge Summary (Signed)
Patient ID: [name] MRN: [id number] DOB/AGE: 1945-08-18 69 y.o.  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 male with a past medical history significant for HTN, hyperlipidemia, DM, CAD s/p PTCA, MI, carotid artery occlusion, adenocarcinoma colon s/p partial colectomy, squamous cel carcinoma neck s/p radiation, cognitive impairment, very frequent falls, brought in unresponsive by EMS. Patient is unresponsive, intubated, and thus all information is obtained from the chart" per report pt was noted by wife to be LSN @ 14:00 today, pts wife reports that he was sleeping & was unresponsive today @ 18:00, upon arrival GCS 8, pt responsive to painful stimuli on EMS arrival to home, upon arrival to ED pt was unresponsive to painful stimuli, pt has nasal trumpet in R nare, #18 L AC, CBG 212, pt rcvd 2 mg Narcan pta without response, pt reported to have recently been treated for UTI with unknown abx, pt in NSR during transport". CT brain was personally reviewed and showed a large acute left parietal intraparenchymal hemorrhage measuring 5.3 x 8.8 cm with surrounding vasogenic edema and 13 mm of left-to-right midline shift. There is compression of the right lateral ventricle. There is a small amount of intraventricular hemorrhage in the right occipital horn of the lateral ventricle. There is mild dilatation of the temporal horns of lateral ventricles compared with 09/26/2014 concerning for developing hydrocephalus. Patient hypertensive in  the ED with SBP 190/120 and IV nicardipine was started. Platelets 158, PTT and INR pending. Patient is also hypothermic. Date last known well: 2014-12-23 Time last known well: 1400 tPA Given: no, ICH  He was admitted to the neuro ICU for further evaluation and treatment. After discussions with the patient's family about his poor prognosis and likelihood of survival without major neurological deficits being highly unlikely family decided to make the patient DO NOT RESUSCITATE and comfort care. Hence ventilatory support was withdrawn and patient was placed on IV morphine drip and kept comfortable. His condition progressive decline and family the comfortable with her decision. Patient was pronounced dead by the nurse at 1542 when he was found to be pulseless, without heart sounds, not breathing and no electrical activity on EKG.  Signed: SETHI,PRAMOD 12-24-14, 5:05 PM

## 2014-12-20 NOTE — Progress Notes (Addendum)
Stroke Team Progress Note  HISTORY Michael Holder is an 69 y.o. male with a past medical history significant for HTN, hyperlipidemia, DM, CAD s/p PTCA, MI, carotid artery occlusion, adenocarcinoma colon s/p partial colectomy, squamous cel carcinoma neck s/p radiation, cognitive impairment, very frequent falls, brought in unresponsive by EMS. Patient is unresponsive, intubated, and thus all information is obtained from the chart" per report pt was noted by wife to be LSN @ 14:00 today, pts wife reports that he was sleeping & was unresponsive today @ 18:00, upon arrival GCS 8, pt responsive to painful stimuli on EMS arrival to home, upon arrival to ED pt was unresponsive to painful stimuli, pt has nasal trumpet in R nare, #18 L AC, CBG 212, pt rcvd 2 mg Narcan pta without response, pt reported to have recently been treated for UTI with unknown abx, pt in NSR during transport". CT brain was personally reviewed and showed a large acute left parietal intraparenchymal hemorrhage measuring 5.3 x 8.8 cm with surrounding vasogenic edema and 13 mm of left-to-right midline shift. There is compression of the right lateral ventricle. There is a small amount of intraventricular hemorrhage in the right occipital horn of the lateral ventricle. There is mild dilatation of the temporal horns of lateral ventricles compared with 09/26/2014 concerning for developing hydrocephalus. Patient hypertensive in the ED with SBP 190/120 and IV nicardipine was started. Platelets 158, PTT and INR pending. Patient is also hypothermic. Date last known well: 12/10/2014 Time last known well: 1400 tPA Given: no, ICH    He was admitted to the neuro ICU for further evaluation and treatment.  SUBJECTIVE His brother, sister and nephew are at the bedside.  Overall he feels his condition is rapidly worsening. The family made him comfort care and DO NOT RESUSCITATE last night and he was extubated early this morning and is currently on morphine  drip.  OBJECTIVE Most recent Vital Signs: Filed Vitals:   12-05-14 0700 Dec 05, 2014 0800 Dec 05, 2014 0900 2014-12-05 1000  BP:      Pulse: 79 77 70 66  Temp: 99.9 F (37.7 C) 99.9 F (37.7 C) 100 F (37.8 C) 100 F (37.8 C)  TempSrc:      Resp: 12 11 10 9   Height:      Weight:      SpO2: 87% 86% 81% 85%   CBG (last 3)  No results for input(s): GLUCAP in the last 72 hours.  IV Fluid Intake:   . morphine Stopped (05-Dec-2014 1535)    MEDICATIONS   PRN:  atropine, LORazepam **OR** LORazepam **OR** LORazepam, morphine, polyvinyl alcohol  Diet:  Diet NPO time specified   Activity:  Bedrest  DVT Prophylaxis:  SCds  CLINICALLY SIGNIFICANT STUDIES Basic Metabolic Panel:   Recent Labs Lab 12/03/2014 1854 12/07/2014 1907 12/10/2014 2041  NA 132* 132* 133*  K 4.7 4.5  --   CL 95* 94*  --   CO2 26  --   --   GLUCOSE 216* 218*  --   BUN 10 12  --   CREATININE 1.01 0.80  --   CALCIUM 8.8*  --   --    Liver Function Tests:   Recent Labs Lab 12/03/2014 1854  AST 33  ALT 16*  ALKPHOS 75  BILITOT 1.0  PROT 6.5  ALBUMIN 3.3*   CBC:   Recent Labs Lab 12/15/2014 1854 12/08/2014 1907  WBC 10.3  --   NEUTROABS 9.5*  --   HGB 12.9* 14.3  HCT 38.2* 42.0  MCV 88.4  --   PLT 158  --    Coagulation:   Recent Labs Lab 12/15/2014 2125  LABPROT 13.7  INR 1.04   Cardiac Enzymes: No results for input(s): CKTOTAL, CKMB, CKMBINDEX, TROPONINI in the last 168 hours. Urinalysis:   Recent Labs Lab 12/17/2014 1926  COLORURINE YELLOW  LABSPEC 1.014  PHURINE 6.5  GLUCOSEU 250*  HGBUR NEGATIVE  BILIRUBINUR NEGATIVE  KETONESUR 15*  PROTEINUR 30*  UROBILINOGEN 0.2  NITRITE NEGATIVE  LEUKOCYTESUR NEGATIVE   Lipid Panel    Component Value Date/Time   CHOL 111 12/01/2013 1157   TRIG 112.0 12/01/2013 1157   HDL 34.10* 12/01/2013 1157   CHOLHDL 3 12/01/2013 1157   VLDL 22.4 12/01/2013 1157   LDLCALC 55 12/01/2013 1157   HgbA1C  Lab Results  Component Value Date   HGBA1C 5.7*  09/23/2014    Urine Drug Screen:      Component Value Date/Time   LABOPIA NONE DETECTED 09/22/2014 2356   COCAINSCRNUR NONE DETECTED 09/22/2014 2356   LABBENZ NONE DETECTED 09/22/2014 2356   AMPHETMU NONE DETECTED 09/22/2014 2356   THCU NONE DETECTED 09/22/2014 2356   LABBARB NONE DETECTED 09/22/2014 2356    Alcohol Level: No results for input(s): ETH in the last 168 hours.  Ct Head Wo Contrast  12/15/2014   CLINICAL DATA:  Altered mental status, patient found down  EXAM: CT HEAD WITHOUT CONTRAST  TECHNIQUE: Contiguous axial images were obtained from the base of the skull through the vertex without intravenous contrast.  COMPARISON:  09/26/2014  FINDINGS: There is a large acute left parietal intraparenchymal hemorrhage measuring 5.3 x 8.8 cm. There is a small amount of intraventricular hemorrhage in the right occipital horn of the lateral ventricle. There is surrounding vasogenic edema. There is 13 mm of left-to-right midline shift. There is compression of the right lateral ventricle. There is no transtentorial herniation.  The basal cisterns are effaced. There is mild dilatation of the temporal horns of the lateral ventricles compared with 09/26/2014.  Visualized portions of the orbits are unremarkable. There is left maxillary sinus mucosal thickening.  The osseous structures are unremarkable.  IMPRESSION: 1. Large acute left parietal intraparenchymal hemorrhage measuring 5.3 x 8.8 cm with surrounding vasogenic edema and 13 mm of left-to-right midline shift. There is compression of the right lateral ventricle. There is a small amount of intraventricular hemorrhage in the right occipital horn of the lateral ventricle. There is mild dilatation of the temporal horns of lateral ventricles compared with 09/26/2014 concerning for developing hydrocephalus. Critical Value/emergent results were called by telephone at the time of interpretation on 12/02/2014 at 8:06 pm to Dr. Aram Beecham, who verbally acknowledged  these results.   Electronically Signed   By: Kathreen Devoid   On: 11/28/2014 20:09   Dg Chest Portable 1 View  11/19/2014   CLINICAL DATA:  Altered mental status, ETT placement  EXAM: PORTABLE CHEST - 1 VIEW  COMPARISON:  None.  FINDINGS: Endotracheal tube with the tip 2.7 cm above the carina. Nasogastric tube coursing below the diaphragm. Bilateral mild interstitial thickening. No pleural effusion or pneumothorax. Stable cardiomediastinal silhouette.  The osseous structures are unremarkable.  IMPRESSION: Endotracheal tube with the tip 2.7 cm above the carina.   Electronically Signed   By: Kathreen Devoid   On: 12/03/2014 19:27       CXR Bilateral mild interstitial thickening. No pleural effusion or pneumothorax. Stable cardiomediastinal silhouette.  EKG  Normal sinus rhythm Therapy Recommendations  Not applicable  Physical  Exam   Elderly Caucasian male who is intubated and sedated on morphine drip. . Afebrile. Head is nontraumatic. Neck is supple without bruit.    Cardiac exam no murmur or gallop. Lungs bilateral conducted sounds to auscultation. Distal pulses are well felt. Neurological Exam : Comatose and unresponsive. Intubated. On morphine drip. Eyes are closed. Minimum withdrawal of the left upper and lower extremity to sternal rub. Left gaze deviation. Pupils 2 mm nonreactive. Right corneal absent left very sluggish. Dolls eye moments are absent. Right lower facial weakness. Tongue midline. Dense right hemiplegia with hypotonia and no withdrawal to pain. Withdraws left upper and lower extremity to painful stimuli. Left plantar downgoing right upgoing. ASSESSMENT Mr. GEORGES VICTORIO is a 69 y.o. male presenting with  unresponsiveness and found to have a large left parietal intracerebral hematoma with cytotoxic edema and subfalcine brain herniation with poor neurological exam. Etiology of hemorrhage likely hypertensive. Family has made patient DO NOT RESUSCITATE and comfort care.    Hospital day  # 1  TREATMENT/PLAN  I have personally examined this patient, reviewed notes, independently viewed imaging studies, participated in medical decision making and plan of care. I have made any additions or clarifications directly to the above note. The patient unfortunately has a massive intracerebral hemorrhage with significant cytotoxic edema, midline shift and brain herniation and is unlikely to survive this hemorrhage and have likely major neurological disability and requiring prolonged tracheostomy, PEG tube and nursing home care which his family and he would not want. I agree with decision for comfort care which would be in his best interest. I had a long discussion with the patient's family members at the bedside and they seemed comfortable with that decision and answered their questions. This patient is critically ill and at significant risk of neurological worsening, death and care requires constant monitoring of vital signs, hemodynamics,respiratory and cardiac monitoring,review of multiple databases, neurological assessment, discussion with family, other specialists and medical decision making of high complexity.I have made any additions or clarifications directly to the above note.  I spent 30 minutes of neurocritical care time  in the care of  this patient.   Antony Contras, MD Medical Director [facility] Pager: [phone] 12-19-2014 11 86 am          To contact Stroke Continuity provider, please refer to [email or web address] After hours, contact General Neurology

## 2014-12-20 NOTE — Progress Notes (Signed)
100 mL fentanyl gtt wasted into sink with Estevan Ryder.

## 2014-12-20 NOTE — Progress Notes (Signed)
Pt has died at 15:42. He is not breathing, has no pulse no heart sounds, no electrical activity on EKG.  Death verified with Gaston Islam RN.

## 2014-12-20 NOTE — Progress Notes (Signed)
   12-15-14 0900  Clinical Encounter Type  Visited With Patient and family together  Visit Type Follow-up;Critical Care;Patient actively dying  Referral From Physician  Spiritual Encounters  Spiritual Needs Grief support  Stress Factors  Family Stress Factors Loss   Chaplain was referred to patient via spiritual care consult. When chaplain arrived patient was surrounded by family members. Chaplain offered support during this time but family members explained that their pastor had been with them this morning and gave them a lot of comfort.  Chaplain let patient's family know he is available for support throughout the day. Chaplain will continue to provide emotional and spiritual support for patient and patient's family as needed.  Oronde Hallenbeck, Claudius Sis, Chaplain  10:01 AM

## 2014-12-20 NOTE — Progress Notes (Signed)
Family refused Oxygen at this time.

## 2014-12-20 NOTE — Progress Notes (Signed)
UR completed.  Family has elected comfort care. If hospital death is not expected, CM/CSW will manage process of transition to hospice care, either at home or facility.  Sandi Mariscal, RN BSN [place] [facility] Trauma/Neuro ICU Case Manager [phone]

## 2014-12-20 NOTE — Progress Notes (Signed)
Pt extubated by respiratory therapy per family wishes. Will continue to monitor.

## 2014-12-20 DEATH — deceased
